# Patient Record
Sex: Female | Born: 1990
Health system: Southern US, Community
[De-identification: ages and names within clinical notes are randomized; demographics above are authoritative.]

## PROBLEM LIST (undated history)

## (undated) DIAGNOSIS — D649 Anemia, unspecified: Secondary | ICD-10-CM

## (undated) DIAGNOSIS — J302 Other seasonal allergic rhinitis: Secondary | ICD-10-CM

## (undated) DIAGNOSIS — J329 Chronic sinusitis, unspecified: Secondary | ICD-10-CM

## (undated) HISTORY — PX: NO PAST SURGERIES: SHX2092

---
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
## 2010-02-23 ENCOUNTER — Emergency Department (HOSPITAL_COMMUNITY): Admission: EM | Admit: 2010-02-23 | Discharge: 2010-02-23 | Payer: Self-pay | Admitting: Emergency Medicine

## 2010-04-30 ENCOUNTER — Emergency Department (HOSPITAL_COMMUNITY): Admission: EM | Admit: 2010-04-30 | Discharge: 2010-04-30 | Payer: Self-pay | Admitting: Family Medicine

## 2010-09-19 LAB — URINALYSIS, ROUTINE W REFLEX MICROSCOPIC
Bilirubin Urine: NEGATIVE
Glucose, UA: NEGATIVE mg/dL
Specific Gravity, Urine: 1.015 (ref 1.005–1.030)
Urobilinogen, UA: 0.2 mg/dL (ref 0.0–1.0)

## 2010-09-19 LAB — CBC
MCH: 28.2 pg (ref 26.0–34.0)
MCHC: 33.2 g/dL (ref 30.0–36.0)
MCV: 84.8 fL (ref 78.0–100.0)
Platelets: 232 10*3/uL (ref 150–400)
RBC: 4.01 MIL/uL (ref 3.87–5.11)
RDW: 13.1 % (ref 11.5–15.5)

## 2010-09-19 LAB — COMPREHENSIVE METABOLIC PANEL
AST: 35 U/L (ref 0–37)
Albumin: 3.2 g/dL — ABNORMAL LOW (ref 3.5–5.2)
BUN: 11 mg/dL (ref 6–23)
Calcium: 8.6 mg/dL (ref 8.4–10.5)
Chloride: 114 mEq/L — ABNORMAL HIGH (ref 96–112)
Creatinine, Ser: 0.8 mg/dL (ref 0.4–1.2)
GFR calc Af Amer: >60 mL/min (ref >60)
Total Bilirubin: 0.3 mg/dL (ref 0.3–1.2)
Total Protein: 7.1 g/dL (ref 6.0–8.3)

## 2010-09-19 LAB — URINE CULTURE
Colony Count: >=100000
Culture  Setup Time: 201108211314

## 2010-09-19 LAB — DIFFERENTIAL
Basophils Absolute: 0 10*3/uL (ref 0.0–0.1)
Eosinophils Relative: 0 % (ref 0–5)
Lymphocytes Relative: 14 % (ref 12–46)
Lymphs Abs: 1.4 10*3/uL (ref 0.7–4.0)
Monocytes Absolute: 0.4 10*3/uL (ref 0.1–1.0)
Neutro Abs: 8.5 10*3/uL — ABNORMAL HIGH (ref 1.7–7.7)

## 2010-09-19 LAB — URINE MICROSCOPIC-ADD ON

## 2010-09-19 LAB — RAPID URINE DRUG SCREEN, HOSP PERFORMED
Barbiturates: NOT DETECTED
Benzodiazepines: NOT DETECTED

## 2010-09-19 LAB — PREGNANCY, URINE: Preg Test, Ur: NEGATIVE

## 2010-11-18 NOTE — Op Note (Signed)
[name], [name]              ACCOUNT NO.:  [id number]   MEDICAL RECORD NO.:  [id number]          PATIENT TYPE:  AMB   LOCATION:  SDC                           FACILITY:  [facility]   PHYSICIAN:  Dois Davenport A. Rivard, M.D. DATE OF BIRTH:  1991/02/11   DATE OF PROCEDURE:  06/27/2007  DATE OF DISCHARGE:                               OPERATIVE REPORT   PREOPERATIVE DIAGNOSIS:  Moderate cervical dysplasia, CIN 2.   POSTOPERATIVE DIAGNOSIS:  Moderate cervical dysplasia, CIN 2.   ANESTHESIA:  Local. Dr. Estanislado Pandy.   PROCEDURE:  LEEP, or loupe electrical excision procedure.   SURGEON:  Dr. Estanislado Pandy.   ESTIMATED BLOOD LOSS:  Minimal.   PROCEDURE:  After being informed of the planned procedure with possible  complications including bleeding, infection, cervical stenosis, cervical  incompetence and persistence of disease, informed consent was obtained.  The patient is taken to OR #3, placed in lithotomy position and draped.   A speculum is inserted in the vagina, and the cervix was prepped with  acetic acid.  Colposcopy confirms the previously known lesion mainly  between the 11 and 1 o'clock position.  We then proceed with a  paracervical block using lidocaine 1% epinephrine 1:200 16 mL in the  usual fashion.  With a medium-sized loupe, we proceed with excision of  the anterior lip and the posterior lip of the cervix with the T zone  included.  The excision area is cauterized.  Hemostasis is adequate.  Monsel is applied to the cervix, and the specimen is identified as  anterior lip and posterior lip of the cervix, sent to pathology.  Instrument and sponge count is complete x2.  Estimated blood loss is  minimal.  The procedure is very well tolerated by the patient who is  taken to recovery room and discharged home in a well and stable  condition.      Crist Fat Rivard, M.D.  Electronically Signed     SAR/MEDQ  D:  06/27/2007  T:  06/28/2007  Job:  [id number]

## 2011-04-07 LAB — RAPID STREP SCREEN (MED CTR MEBANE ONLY): Streptococcus, Group A Screen (Direct): NEGATIVE

## 2011-06-16 ENCOUNTER — Encounter: Payer: Self-pay | Admitting: *Deleted

## 2011-06-16 ENCOUNTER — Emergency Department (HOSPITAL_BASED_OUTPATIENT_CLINIC_OR_DEPARTMENT_OTHER)
Admission: EM | Admit: 2011-06-16 | Discharge: 2011-06-16 | Disposition: A | Discharge status: Home / Self Care | Payer: Self-pay | Attending: Emergency Medicine | Admitting: Emergency Medicine

## 2011-06-16 DIAGNOSIS — R5381 Other malaise: Secondary | ICD-10-CM | POA: Insufficient documentation

## 2011-06-16 LAB — URINALYSIS, ROUTINE W REFLEX MICROSCOPIC
Glucose, UA: NEGATIVE mg/dL
Leukocytes, UA: NEGATIVE
Protein, ur: NEGATIVE mg/dL
Specific Gravity, Urine: 1.03 (ref 1.005–1.030)
Urobilinogen, UA: 1 mg/dL (ref 0.0–1.0)

## 2011-06-16 LAB — PREGNANCY, URINE: Preg Test, Ur: NEGATIVE

## 2011-06-16 MED ORDER — OSELTAMIVIR PHOSPHATE 75 MG PO CAPS: 75.0000 mg | ORAL_CAPSULE | Freq: Two times a day (BID) | ORAL | Status: AC

## 2011-06-16 MED ORDER — NAPROXEN 500 MG PO TABS: 500.0000 mg | ORAL_TABLET | Freq: Two times a day (BID) | ORAL | Status: DC

## 2011-06-16 NOTE — ED Notes (Signed)
C/o body aches, headache, and weakness for few days

## 2011-06-16 NOTE — ED Provider Notes (Signed)
History     CSN: [id number] Arrival date & time: 06/16/2011  9:06 PM   First MD Initiated Contact with Patient 06/16/11 2123      Chief Complaint  Patient presents with  . Fatigue    (Consider location/radiation/quality/duration/timing/severity/associated sxs/prior treatment) HPI Pt has been having aches and soreness for the last few days.  No coughing.  She does have some nasal congestion and a headache.  She does have myalgias.  No vomiting or diarrhea.  No rashes.  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  She was exposed to a coworker who had the flu.   History reviewed. No pertinent past medical history.  History reviewed. No pertinent past surgical history.  History reviewed. No pertinent family history.  History  Substance Use Topics  . Smoking status: Never Smoker   . Smokeless tobacco: Not on file  . Alcohol Use: No    OB History    Grav Para Term Preterm Abortions TAB SAB Ect Mult Living                  Review of Systems  All other systems reviewed and are negative.    Allergies  Review of patient's allergies indicates no known allergies.  Home Medications   Current Outpatient Rx  Name Route Sig Dispense Refill  . NORETHINDRONE ACET-ETHINYL EST 1.5-30 MG-MCG PO TABS Oral Take 1 tablet by mouth daily.        BP 117/74  Pulse 88  Temp(Src) 98.4 F (36.9 C) (Oral)  Resp 16  Ht 5\' 7"  (1.702 m)  Wt 140 lb (63.504 kg)  BMI 21.93 kg/m2  SpO2 100%  LMP 05/16/2011  Physical Exam  Nursing note and vitals reviewed. Constitutional: She appears well-developed and well-nourished. No distress.  HENT:  Head: Normocephalic and atraumatic.  Right Ear: External ear and ear canal normal. Tympanic membrane is not injected.  Left Ear: External ear and ear canal normal. Tympanic membrane is not injected.  Mouth/Throat: No oropharyngeal exudate.  Eyes: Conjunctivae are normal. Right eye exhibits no discharge. Left eye exhibits no  discharge. No scleral icterus.  Neck: Neck supple. No tracheal deviation present.  Cardiovascular: Normal rate, regular rhythm and intact distal pulses.   Pulmonary/Chest: Effort normal and breath sounds normal. No stridor. No respiratory distress. She has no wheezes. She has no rales.  Abdominal: Soft. Bowel sounds are normal. She exhibits no distension. There is no tenderness. There is no rebound and no guarding.  Musculoskeletal: She exhibits no edema and no tenderness.  Neurological: She is alert. She has normal strength. No sensory deficit. Cranial nerve deficit:  no gross defecits noted. She exhibits normal muscle tone. She displays no seizure activity. Coordination normal.  Skin: Skin is warm and dry. No rash noted. No erythema. No pallor.  Psychiatric: She has a normal mood and affect.    ED Course  Procedures (including critical care time)   Labs Reviewed  URINALYSIS, ROUTINE W REFLEX MICROSCOPIC  PREGNANCY, URINE   No results found.    MDM  Patient has had influenza exposure. She has not actually measured a fever yet but she has been having the body aches. She has not had significant cough. At this time I doubt meningitis, pneumonia, urinary tract infection. Encourage patient to continue medication such as Tylenol or Advil. She should drink many fluids. I will give her prescription for Tamiflu and instructed not to fill it and less she started having coughing and  fever.        Celene Kras, MD 06/16/11 2138

## 2012-02-19 IMAGING — CT CT HEAD W/O CM
1 of 2 series · 16 of 30 positions shown, 20 images · non-contrast
Comparison: None

CLINICAL DATA: Unresponsive.

CT HEAD WITHOUT CONTRAST
TECHNIQUE: Contiguous axial images were obtained from the base of
the skull through the vertex without contrast.

[Series 3: recon 2: brain · axial · 0.47mm/px · z∈[+106,+248]mm · 16 of 64 slices shown, 20 images]
[im 4/64  brain]
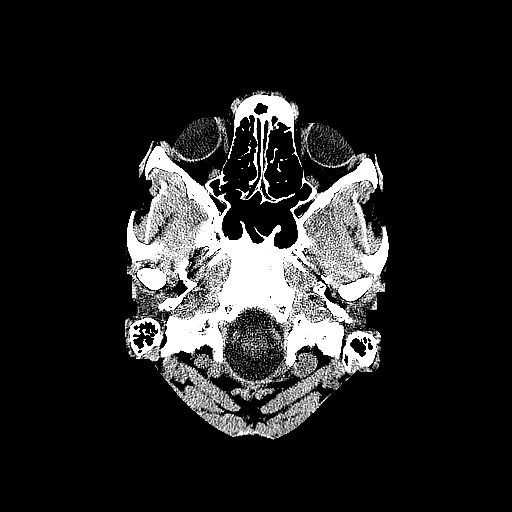
[im 4/64  bone]
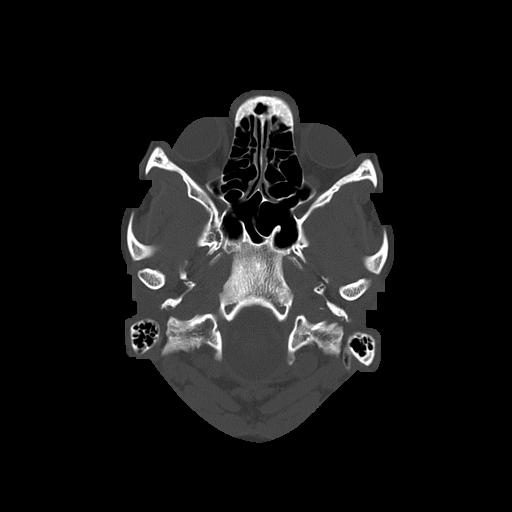
[im 7/64  brain]
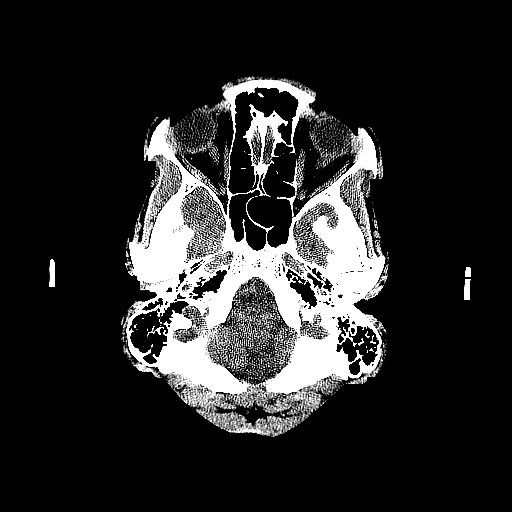
[im 10/64  brain]
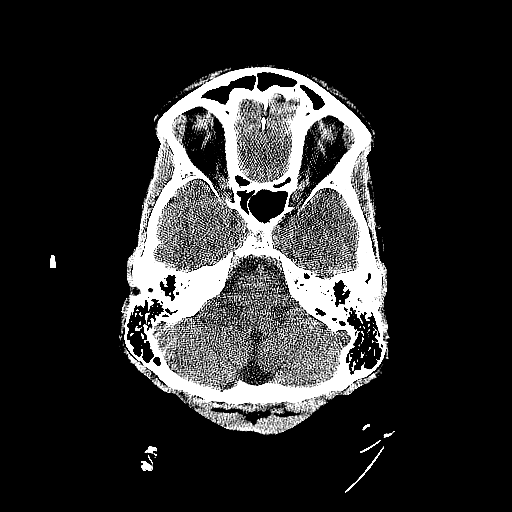
[im 14/64  brain]
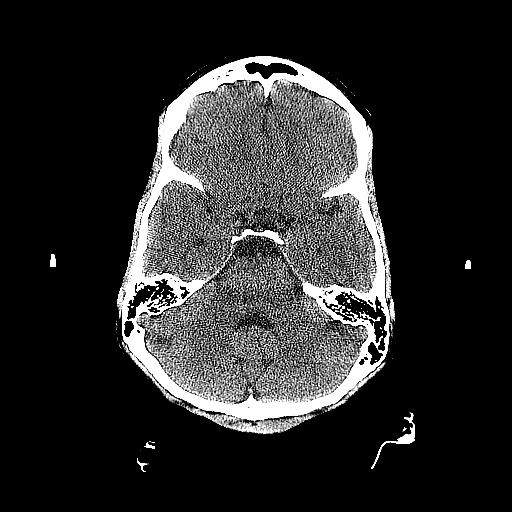
[im 20/64  brain]
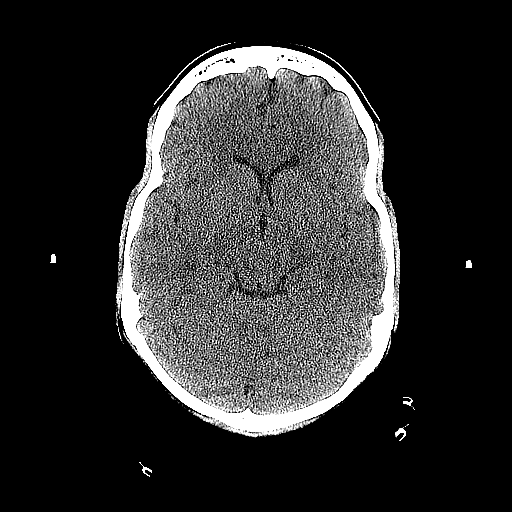
[im 20/64  bone]
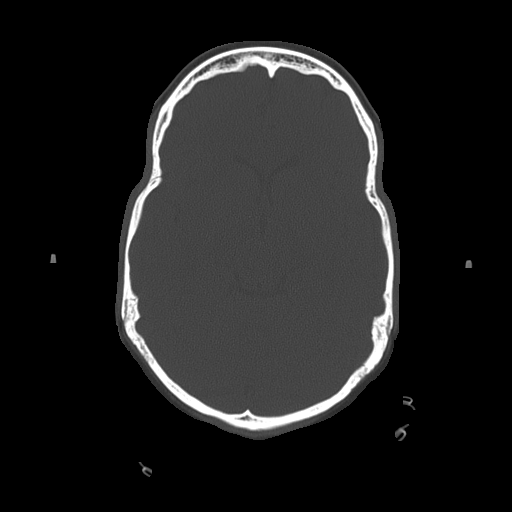
[im 24/64  brain]
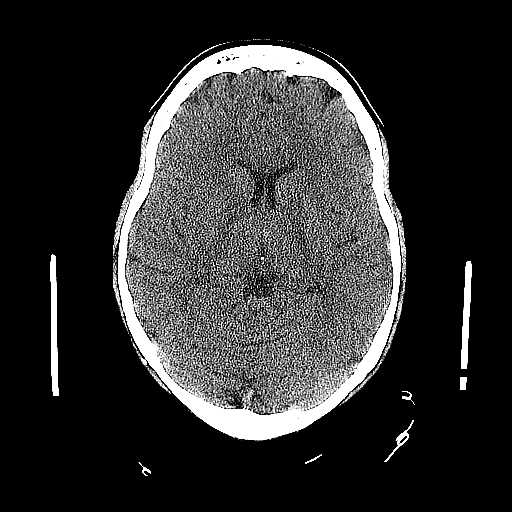
[im 27/64  brain]
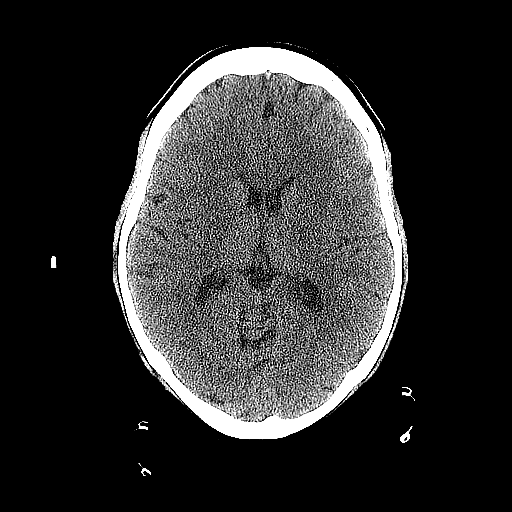
[im 30/64  brain]
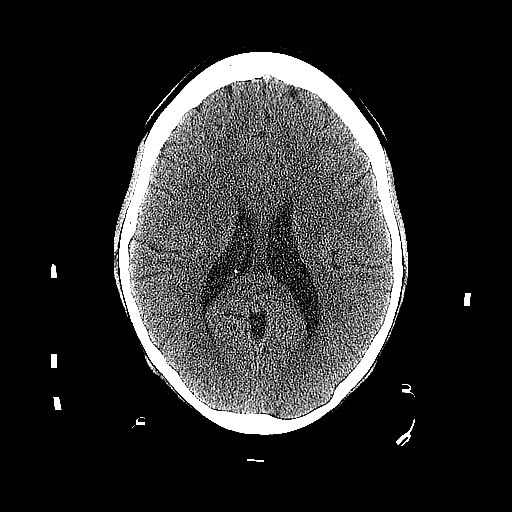
[im 34/64  brain]
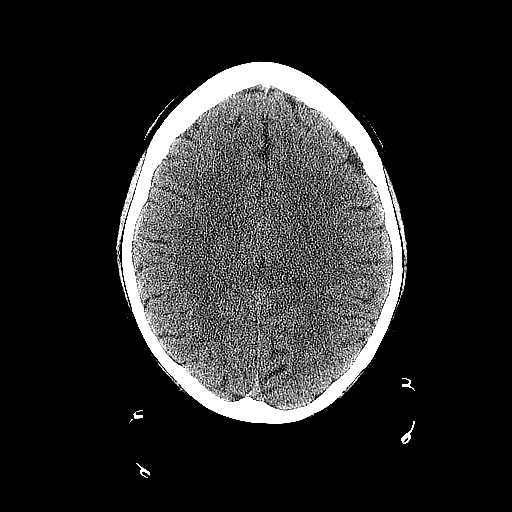
[im 34/64  bone]
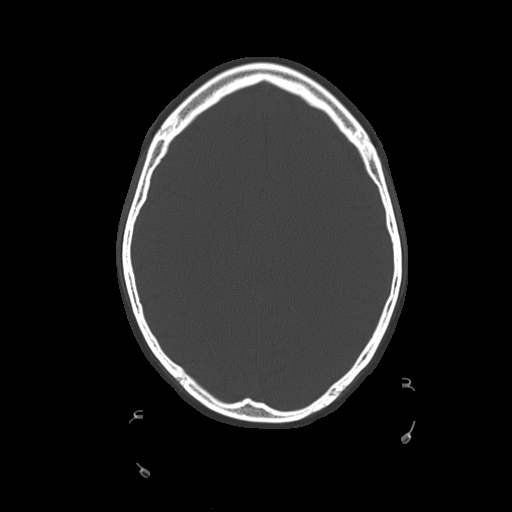
[im 37/64  brain]
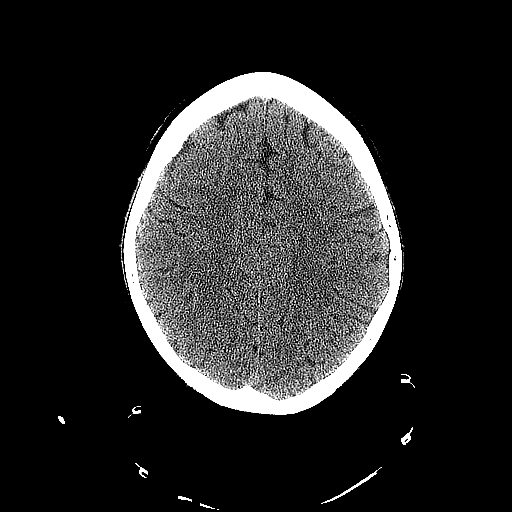
[im 40/64  brain]
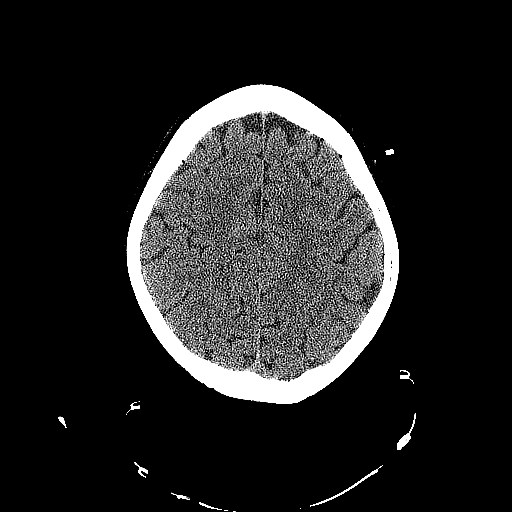
[im 44/64  brain]
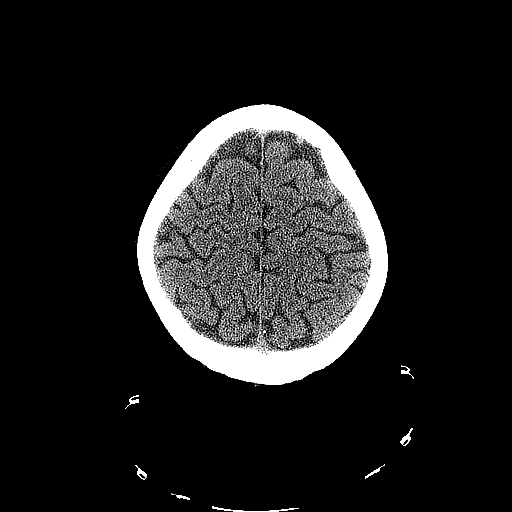
[im 50/64  brain]
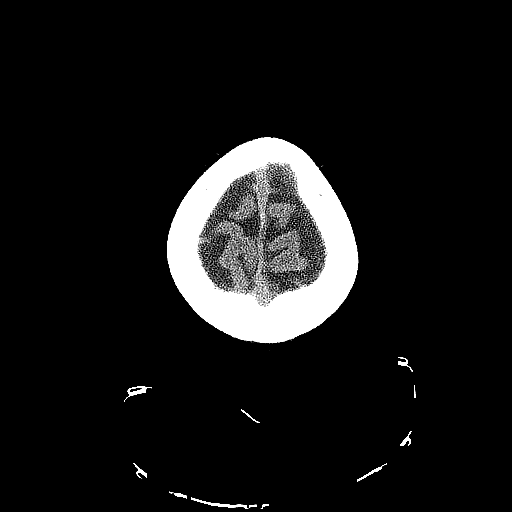
[im 50/64  bone]
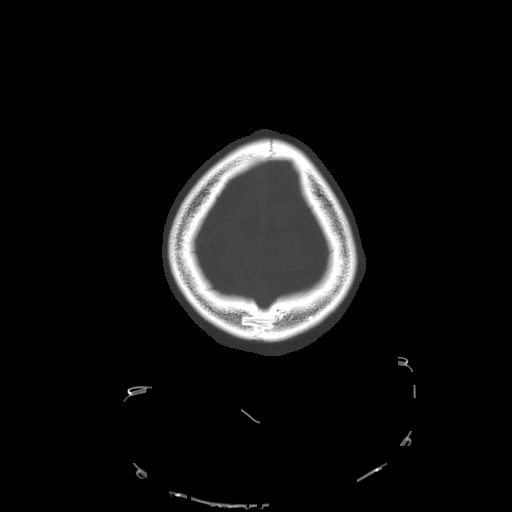
[im 54/64  brain]
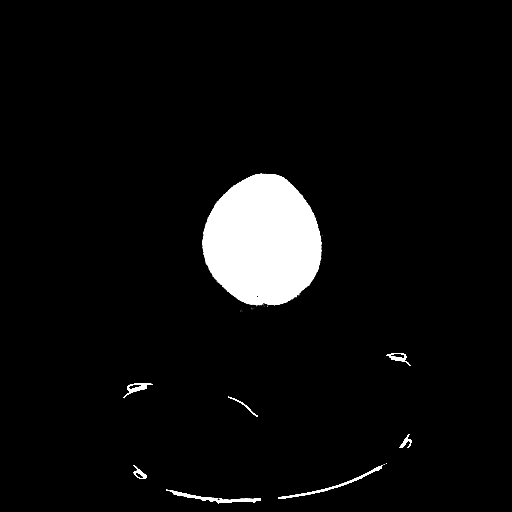
[im 57/64  brain]
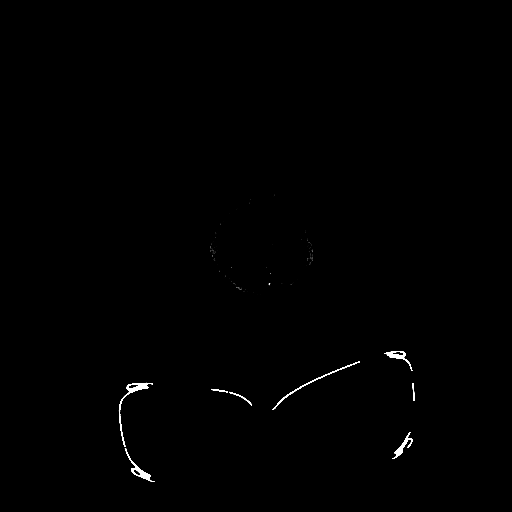
[im 60/64  brain]
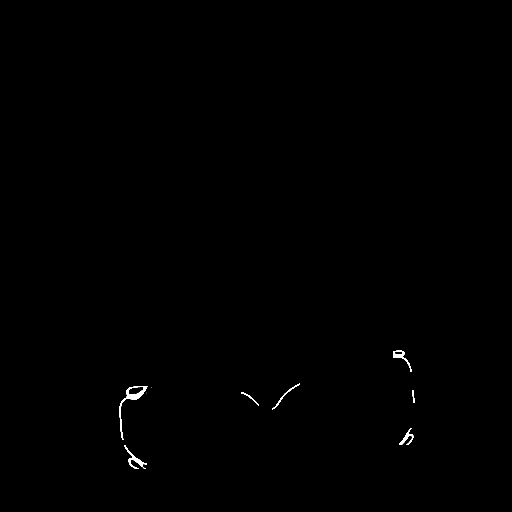

[16 of 30 positions shown; findings below may reference images not displayed]

FINDINGS: No acute intracranial abnormality.  Specifically, no
hemorrhage, hydrocephalus, mass lesion, acute infarction, or
significant intracranial injury.  No acute calvarial abnormality.
Visualized paranasal sinuses and mastoids clear.  Orbital soft
tissues unremarkable.
IMPRESSION: Normal study.

## 2012-03-15 ENCOUNTER — Encounter (HOSPITAL_COMMUNITY): Payer: Self-pay

## 2012-03-15 ENCOUNTER — Inpatient Hospital Stay (HOSPITAL_COMMUNITY)
Admission: AD | Admit: 2012-03-15 | Discharge: 2012-03-15 | Disposition: A | Discharge status: Home / Self Care | Payer: Self-pay | Source: Ambulatory Visit | Attending: Obstetrics and Gynecology | Admitting: Obstetrics and Gynecology

## 2012-03-15 DIAGNOSIS — D649 Anemia, unspecified: Secondary | ICD-10-CM

## 2012-03-15 DIAGNOSIS — R42 Dizziness and giddiness: Secondary | ICD-10-CM | POA: Insufficient documentation

## 2012-03-15 DIAGNOSIS — Z3202 Encounter for pregnancy test, result negative: Secondary | ICD-10-CM | POA: Insufficient documentation

## 2012-03-15 DIAGNOSIS — R5383 Other fatigue: Secondary | ICD-10-CM | POA: Insufficient documentation

## 2012-03-15 DIAGNOSIS — H538 Other visual disturbances: Secondary | ICD-10-CM | POA: Insufficient documentation

## 2012-03-15 DIAGNOSIS — R5381 Other malaise: Secondary | ICD-10-CM | POA: Insufficient documentation

## 2012-03-15 HISTORY — DX: Anemia, unspecified: D64.9

## 2012-03-15 LAB — COMPREHENSIVE METABOLIC PANEL
ALT: 13 U/L (ref 0–35)
AST: 20 U/L (ref 0–37)
Albumin: 3.6 g/dL (ref 3.5–5.2)
Calcium: 9.5 mg/dL (ref 8.4–10.5)
GFR calc Af Amer: >90 mL/min (ref >90)
Sodium: 139 mEq/L (ref 135–145)
Total Protein: 7.5 g/dL (ref 6.0–8.3)

## 2012-03-15 LAB — CBC
HCT: 33.8 % — ABNORMAL LOW (ref 36.0–46.0)
Hemoglobin: 10.5 g/dL — ABNORMAL LOW (ref 12.0–15.0)
MCH: 25.7 pg — ABNORMAL LOW (ref 26.0–34.0)
MCHC: 31.1 g/dL (ref 30.0–36.0)
MCV: 82.8 fL (ref 78.0–100.0)
Platelets: 253 K/uL (ref 150–400)
RBC: 4.08 MIL/uL (ref 3.87–5.11)
RDW: 14.8 % (ref 11.5–15.5)
WBC: 7.8 K/uL (ref 4.0–10.5)

## 2012-03-15 LAB — URINALYSIS, ROUTINE W REFLEX MICROSCOPIC
Bilirubin Urine: NEGATIVE
Glucose, UA: NEGATIVE mg/dL
Hgb urine dipstick: NEGATIVE
Ketones, ur: NEGATIVE mg/dL
Leukocytes, UA: NEGATIVE
Nitrite: NEGATIVE
Protein, ur: NEGATIVE mg/dL
Specific Gravity, Urine: >1.03 — ABNORMAL HIGH (ref 1.005–1.030)
Urobilinogen, UA: 0.2 mg/dL (ref 0.0–1.0)
pH: 6 (ref 5.0–8.0)

## 2012-03-15 NOTE — MAU Provider Note (Signed)
  History     CSN: [id number]  Arrival date and time: 03/15/12 1734   First Provider Initiated Contact with Patient 03/15/12 2144      Chief Complaint  Patient presents with  . Fatigue   HPI Sheena Cole is a 21 y.o. female who presents to MAU with fatigue. Symptoms started 2 weeks ago. Hx of anemia, no taking any iron or vitamins. Last night while in the shower got dizzy and passed out. This morning getting ready for work and felt dizzy but did not pass out. She is not pregnant and on no birth control. Sexually active with one partner x 2 years. No hx of STI's. Last pap smear one year ago with CC/OB. Also dx with anemia by CC/OB and given Fe. PCP is Josph Macho, PA. The history was provided by the patient.  OB History    Grav Para Term Preterm Abortions TAB SAB Ect Mult Living   0 0 0 0 0 0 0 0 0 0       Past Medical History  Diagnosis Date  . Anemia     Past Surgical History  Procedure Date  . No past surgeries     Family History  Problem Relation Age of Onset  . Other Neg Hx     History  Substance Use Topics  . Smoking status: Never Smoker   . Smokeless tobacco: Not on file  . Alcohol Use: Yes     socially    Allergies: No Known Allergies  Prescriptions prior to admission  Medication Sig Dispense Refill  . ibuprofen (ADVIL,MOTRIN) 200 MG tablet Take 400 mg by mouth every 6 (six) hours as needed. For headache      . naproxen sodium (ANAPROX) 220 MG tablet Take 220 mg by mouth daily as needed. For pain or headache        ROS: As stated in HPI  Blood pressure 134/92, pulse 69, temperature 98 F (36.7 C), temperature source Oral, resp. rate 18, height 5\' 9"  (1.753 m), weight 137 lb (62.143 kg), last menstrual period 02/06/2012, SpO2 100.00%.  Physical Exam: patient is alert and oriented and in no acute distress. Medical screening exam complete and patient is stable for continued care by the CC/OB Midwife. Procedures  Atreus Hasz, RN, FNP, [facility], [facility] 03/15/2012,  9:44 PM

## 2012-03-15 NOTE — MAU Note (Signed)
Pt reports off/on she has periods where her vision gets blurred and she feels like she is going to pass out, usually if she eats something it passes. Last pm and once this am it was worse and she "almost passed out", states decreased appetite and fatigue. LMP 02/06/2012

## 2012-03-15 NOTE — MAU Provider Note (Signed)
History     CSN: [id number]  Arrival date and time: 03/15/12 1734   First Provider Initiated Contact with Patient 03/15/12 2243      Chief Complaint  Patient presents with  . Fatigue   HPI Comments: Pt is a 21yo G0 arrives unannounced c/o fatigue, dizziness and occ blurry vision. States she's not been seen at [facility] in [facility].5years. States she has been dx'd w anemia but has not taken FE supplement. Denies any other health problems and no other regular meds. Pt told NP in MAU she had been seen this year. When I asked states it's been at least 1.5 years     Past Medical History  Diagnosis Date  . Anemia     Past Surgical History  Procedure Date  . No past surgeries     Family History  Problem Relation Age of Onset  . Other Neg Hx     History  Substance Use Topics  . Smoking status: Never Smoker   . Smokeless tobacco: Not on file  . Alcohol Use: Yes     socially    Allergies: No Known Allergies  No prescriptions prior to admission    Review of Systems  All other systems reviewed and are negative.   Physical Exam   Blood pressure 134/92, pulse 69, temperature 98 F (36.7 C), temperature source Oral, resp. rate 18, height 5\' 9"  (1.753 m), weight 137 lb (62.143 kg), last menstrual period 02/06/2012, SpO2 100.00%.  Physical Exam  Nursing note and vitals reviewed. Constitutional: She is oriented to person, place, and time. She appears well-developed and well-nourished.  HENT:  Head: Normocephalic.  Eyes: Pupils are equal, round, and reactive to light.  Neck: Normal range of motion.  Cardiovascular: Normal rate, regular rhythm and normal heart sounds.   Respiratory: Effort normal and breath sounds normal.  GI: Soft. Bowel sounds are normal.  Genitourinary:       Deferred   Musculoskeletal: Normal range of motion.  Neurological: She is alert and oriented to person, place, and time.  Skin: Skin is warm and dry.  Psychiatric: She has a normal mood and affect. Her  behavior is normal.   Results for orders placed during the hospital encounter of 03/15/12 (from the past 24 hour(s))  CBC     Status: Abnormal   Collection Time   03/15/12  5:52 PM      Component Value Range   WBC 7.8  4.0 - 10.5 K/uL   RBC 4.08  3.87 - 5.11 MIL/uL   Hemoglobin 10.5 (*) 12.0 - 15.0 g/dL   HCT 40.9 (*) 81.1 - 91.4 %   MCV 82.8  78.0 - 100.0 fL   MCH 25.7 (*) 26.0 - 34.0 pg   MCHC 31.1  30.0 - 36.0 g/dL   RDW 78.2  95.6 - 21.3 %   Platelets 253  150 - 400 K/uL  COMPREHENSIVE METABOLIC PANEL     Status: Normal   Collection Time   03/15/12  5:52 PM      Component Value Range   Sodium 139  135 - 145 mEq/L   Potassium 5.0  3.5 - 5.1 mEq/L   Chloride 107  96 - 112 mEq/L   CO2 25  19 - 32 mEq/L   Glucose, Bld 92  70 - 99 mg/dL   BUN 12  6 - 23 mg/dL   Creatinine, Ser 0.86  0.50 - 1.10 mg/dL   Calcium 9.5  8.4 - 57.8 mg/dL   Total  Protein 7.5  6.0 - 8.3 g/dL   Albumin 3.6  3.5 - 5.2 g/dL   AST 20  0 - 37 U/L   ALT 13  0 - 35 U/L   Alkaline Phosphatase 73  39 - 117 U/L   Total Bilirubin 0.3  0.3 - 1.2 mg/dL   GFR calc non Af Amer >90  >90 mL/min   GFR calc Af Amer >90  >90 mL/min  URINALYSIS, ROUTINE W REFLEX MICROSCOPIC     Status: Abnormal   Collection Time   03/15/12  7:00 PM      Component Value Range   Color, Urine YELLOW  YELLOW   APPearance CLEAR  CLEAR   Specific Gravity, Urine >1.030 (*) 1.005 - 1.030   pH 6.0  5.0 - 8.0   Glucose, UA NEGATIVE  NEGATIVE mg/dL   Hgb urine dipstick NEGATIVE  NEGATIVE   Bilirubin Urine NEGATIVE  NEGATIVE   Ketones, ur NEGATIVE  NEGATIVE mg/dL   Protein, ur NEGATIVE  NEGATIVE mg/dL   Urobilinogen, UA 0.2  0.0 - 1.0 mg/dL   Nitrite NEGATIVE  NEGATIVE   Leukocytes, UA NEGATIVE  NEGATIVE  POCT PREGNANCY, URINE     Status: Normal   Collection Time   03/15/12  7:10 PM      Component Value Range   Preg Test, Ur NEGATIVE  NEGATIVE     MAU Course  Procedures    Assessment and Plan  UPT neg Urine s.g.=>1.030,  otherwise normal Hgb=10.5  Recommend FE supplement Increase water intake F/u w primary care physician    Mohammedali Bedoy M 03/15/2012, 11:07 PM

## 2012-04-21 ENCOUNTER — Emergency Department (HOSPITAL_COMMUNITY): Payer: Self-pay

## 2012-04-21 ENCOUNTER — Encounter (HOSPITAL_COMMUNITY): Payer: Self-pay | Admitting: Emergency Medicine

## 2012-04-21 ENCOUNTER — Emergency Department (HOSPITAL_COMMUNITY)
Admission: EM | Admit: 2012-04-21 | Discharge: 2012-04-22 | Disposition: A | Discharge status: Home / Self Care | Payer: Self-pay | Attending: Emergency Medicine | Admitting: Emergency Medicine

## 2012-04-21 DIAGNOSIS — T50902A Poisoning by unspecified drugs, medicaments and biological substances, intentional self-harm, initial encounter: Secondary | ICD-10-CM

## 2012-04-21 DIAGNOSIS — T50992A Poisoning by other drugs, medicaments and biological substances, intentional self-harm, initial encounter: Secondary | ICD-10-CM | POA: Insufficient documentation

## 2012-04-21 DIAGNOSIS — T454X4A Poisoning by iron and its compounds, undetermined, initial encounter: Secondary | ICD-10-CM | POA: Insufficient documentation

## 2012-04-21 DIAGNOSIS — T1491XA Suicide attempt, initial encounter: Secondary | ICD-10-CM

## 2012-04-21 DIAGNOSIS — T40601A Poisoning by unspecified narcotics, accidental (unintentional), initial encounter: Secondary | ICD-10-CM | POA: Insufficient documentation

## 2012-04-21 LAB — COMPREHENSIVE METABOLIC PANEL
ALT: 10 U/L (ref 0–35)
AST: 16 U/L (ref 0–37)
Alkaline Phosphatase: 60 U/L (ref 39–117)
BUN: 11 mg/dL (ref 6–23)
CO2: 22 mEq/L (ref 19–32)
Calcium: 8.2 mg/dL — ABNORMAL LOW (ref 8.4–10.5)
Chloride: 104 mEq/L (ref 96–112)
GFR calc Af Amer: >90 mL/min (ref >90)
GFR calc Af Amer: >90 mL/min (ref >90)
GFR calc non Af Amer: >90 mL/min (ref >90)
Glucose, Bld: 84 mg/dL (ref 70–99)
Glucose, Bld: 86 mg/dL (ref 70–99)
Sodium: 135 mEq/L (ref 135–145)
Total Bilirubin: 0.3 mg/dL (ref 0.3–1.2)
Total Protein: 6.4 g/dL (ref 6.0–8.3)

## 2012-04-21 LAB — CBC
HCT: 29.6 % — ABNORMAL LOW (ref 36.0–46.0)
Hemoglobin: 9.5 g/dL — ABNORMAL LOW (ref 12.0–15.0)
MCH: 26.5 pg (ref 26.0–34.0)
MCHC: 32.1 g/dL (ref 30.0–36.0)
MCV: 82.5 fL (ref 78.0–100.0)

## 2012-04-21 LAB — CBC WITH DIFFERENTIAL/PLATELET
Basophils Absolute: 0 10*3/uL (ref 0.0–0.1)
Basophils Relative: 0 % (ref 0–1)
HCT: 28.8 % — ABNORMAL LOW (ref 36.0–46.0)
Hemoglobin: 9.4 g/dL — ABNORMAL LOW (ref 12.0–15.0)
Lymphocytes Relative: 29 % (ref 12–46)
MCHC: 32.6 g/dL (ref 30.0–36.0)
Monocytes Relative: 8 % (ref 3–12)
Neutro Abs: 4.8 10*3/uL (ref 1.7–7.7)
Neutrophils Relative %: 62 % (ref 43–77)
WBC: 7.8 10*3/uL (ref 4.0–10.5)

## 2012-04-21 LAB — POCT PREGNANCY, URINE: Preg Test, Ur: NEGATIVE

## 2012-04-21 LAB — ETHANOL: Alcohol, Ethyl (B): <11 mg/dL (ref 0–11)

## 2012-04-21 LAB — IRON: Iron: 245 ug/dL — ABNORMAL HIGH (ref 42–135)

## 2012-04-21 LAB — RAPID URINE DRUG SCREEN, HOSP PERFORMED
Benzodiazepines: NOT DETECTED
Opiates: POSITIVE — AB

## 2012-04-21 LAB — SALICYLATE LEVEL: Salicylate Lvl: 2.4 mg/dL — ABNORMAL LOW (ref 2.8–20.0)

## 2012-04-21 MED ORDER — ONDANSETRON HCL 4 MG PO TABS: 4.0000 mg | ORAL_TABLET | Freq: Three times a day (TID) | ORAL | Status: DC | PRN

## 2012-04-21 MED ORDER — LORAZEPAM 1 MG PO TABS: 1.0000 mg | ORAL_TABLET | Freq: Three times a day (TID) | ORAL | Status: DC | PRN

## 2012-04-21 MED ORDER — SODIUM CHLORIDE 0.9 % IV SOLN
1000.0000 mL | Freq: Once | INTRAVENOUS | Status: AC
  Administered 2012-04-21: 1000 mL via INTRAVENOUS

## 2012-04-21 MED ORDER — ZOLPIDEM TARTRATE 5 MG PO TABS: 5.0000 mg | ORAL_TABLET | Freq: Every evening | ORAL | Status: DC | PRN

## 2012-04-21 MED ORDER — SODIUM CHLORIDE 0.9 % IV SOLN
1000.0000 mL | INTRAVENOUS | Status: DC
  Administered 2012-04-21: 1000 mL via INTRAVENOUS

## 2012-04-21 NOTE — ED Notes (Signed)
Asked pt. To inform RN if she did not feel well, pt. Verbalized understanding.

## 2012-04-21 NOTE — ED Notes (Addendum)
Lu Duffel from [facility] called. They have received both samples and will process them STAT now. Results should be available in EPIC in 30-45 minutes. If not, call Lu Duffel directly at [phone].

## 2012-04-21 NOTE — ED Notes (Signed)
Telepsych consult being done at pt.'s bedside.

## 2012-04-21 NOTE — ED Notes (Signed)
Attempted to call the lab at [facility] to find out if they can run the ferritin level stat. Call was disconnected while speaking to the lab manager. Attempting to reconnect now.

## 2012-04-21 NOTE — ED Notes (Signed)
Spoke with Environmental manager at [facility].  States to [facility] any respiratory depression with Narcan.  Get iron level and KUB-further treatment after these have resulted.  NS fluid bolus and hydrate pt well.  Repeat iron level in 3 hours-get tylenol level at 0300.  Medical clearance labs, monitor.

## 2012-04-21 NOTE — ED Notes (Signed)
Pt wanting to know when she can go home. It was explained that when her iron level was normal the MD would release her.

## 2012-04-21 NOTE — ED Notes (Signed)
Provided an update to poison control. She said that the acetaminophen was not at toxic levels. To notify them when the ferritin levels have resulted.

## 2012-04-21 NOTE — ED Notes (Signed)
Spoke with lab-states both blood samples for the Ferritin levels are in the lab here.  States awaiting stat courier to take both samples to [facility].

## 2012-04-21 NOTE — Clinical Social Work Note (Signed)
Tele-psych recommendation to d/c home.  CSW gave pt outpt mental health resource list for outpatient f/u care. Vickii Penna, LCSWA [phone]  Clinical Social Work

## 2012-04-21 NOTE — ED Notes (Signed)
Spoke with Sheena Cole at the [facility]. They are locating the specimen now and sending a stat courier for the 2nd sample. Will contact us with the results.

## 2012-04-21 NOTE — ED Notes (Signed)
Per pt, she took 8 hydrocodone 7.5/500 and 20 iron tablets (Ferralet 90mg ). EMS called by her roommates. Per EMS, pt responded slowly to sternal rub. Sats originally 83%. NRB brought sats back up to 100% 2mg  Narcan given enroute. Pt A&O upon arrival.Sats 100% on ra.

## 2012-04-21 NOTE — ED Provider Notes (Addendum)
History     CSN: [id number]  Arrival date & time 04/21/12  0030   First MD Initiated Contact with Patient 04/21/12 0059      Chief Complaint  Patient presents with  . Drug Overdose     HPI The patient presents to the emergency room after an intentional drug overdose. Patient states she took these medications at about 11:30 this evening. She called her friend who apparently then called 911. Patient states she took 8 hydrocodone tablets that were 7.5 mg/500 mg as well as maybe 20, 90 mg iron tablets. Patient was found by EMS somewhat somnolent. She was given 2 mg of Narcan en route with improvement. Patient is currently alert and oriented. She denies any complaints. She does not want to tell me about what caused her to take these medications and she is also very vague about her intent. She actually denies suicidal ideation at this time but will not elaborate any more than that and will not explain why she took the medications the way she did Past Medical History  Diagnosis Date  . Anemia     Past Surgical History  Procedure Date  . No past surgeries     Family History  Problem Relation Age of Onset  . Other Neg Hx     History  Substance Use Topics  . Smoking status: Never Smoker   . Smokeless tobacco: Not on file  . Alcohol Use: Yes     socially    OB History    Grav Para Term Preterm Abortions TAB SAB Ect Mult Living   0 0 0 0 0 0 0 0 0 0       Review of Systems  All other systems reviewed and are negative.    Allergies  Review of patient's allergies indicates no known allergies.  Home Medications   Current Outpatient Rx  Name Route Sig Dispense Refill  . IBUPROFEN 200 MG PO TABS Oral Take 400 mg by mouth every 6 (six) hours as needed. For headache    . NAPROXEN SODIUM 220 MG PO TABS Oral Take 220 mg by mouth daily as needed. For pain or headache      BP 125/79  Pulse 101  Temp 98.5 F (36.9 C) (Oral)  Resp 17  SpO2 100%  LMP 03/22/2012  Physical  Exam  Nursing note and vitals reviewed. Constitutional: She appears well-developed and well-nourished. No distress.  HENT:  Head: Normocephalic and atraumatic.  Right Ear: External ear normal.  Left Ear: External ear normal.  Eyes: Conjunctivae normal are normal. Right eye exhibits no discharge. Left eye exhibits no discharge. No scleral icterus.  Neck: Neck supple. No tracheal deviation present.  Cardiovascular: Normal rate, regular rhythm and intact distal pulses.   Pulmonary/Chest: Effort normal and breath sounds normal. No stridor. No respiratory distress. She has no wheezes. She has no rales.  Abdominal: Soft. Bowel sounds are normal. She exhibits no distension. There is no tenderness. There is no rebound and no guarding.  Musculoskeletal: She exhibits no edema and no tenderness.  Neurological: She is alert. She has normal strength. No sensory deficit. Cranial nerve deficit:  no gross defecits noted. She exhibits normal muscle tone. She displays no seizure activity. Coordination normal.  Skin: Skin is warm and dry. No rash noted.  Psychiatric: She has a normal mood and affect.    ED Course  Procedures (including critical care time)  Labs Reviewed  CBC WITH DIFFERENTIAL - Abnormal; Notable for the following:  RBC 3.54 (*)     Hemoglobin 9.4 (*)     HCT 28.8 (*)     All other components within normal limits  FERRITIN - Abnormal; Notable for the following:    Ferritin 5 (*)     All other components within normal limits  SALICYLATE LEVEL - Abnormal; Notable for the following:    Salicylate Lvl 2.4 (*)     All other components within normal limits  URINE RAPID DRUG SCREEN (HOSP PERFORMED) - Abnormal; Notable for the following:    Opiates POSITIVE (*)     All other components within normal limits  COMPREHENSIVE METABOLIC PANEL - Abnormal; Notable for the following:    Potassium 3.3 (*)     Calcium 8.2 (*)     Albumin 3.1 (*)     All other components within normal limits    ACETAMINOPHEN LEVEL - Abnormal; Notable for the following:    Acetaminophen (Tylenol), Serum 32.0 (*)     All other components within normal limits  ACETAMINOPHEN LEVEL - Abnormal; Notable for the following:    Acetaminophen (Tylenol), Serum 34.8 (*)     All other components within normal limits  FERRITIN - Abnormal; Notable for the following:    Ferritin 4 (*)     All other components within normal limits  ETHANOL  POCT PREGNANCY, URINE  PROTIME-INR   Dg Abd 1 View  04/21/2012  *RADIOLOGY REPORT*  Clinical Data: Drug overdose.  ABDOMEN - 1 VIEW  Comparison: None.  Findings: Scattered gas and stool in the colon and small bowel.  No small or large bowel distension.  No radiopaque stones.  Visualized bones appear intact.  Metallic piercing along the midline.  IMPRESSION: Nonobstructive bowel gas pattern.   Original Report Authenticated By: Marlon Pel, M.D.      1. Suicide attempt   2. Drug overdose, intentional       MDM  1800 mg iron potentially.  <20 mg/kg  The patient's 4 hour acetaminophen level is less than the toxic level. Her ferritin level is still pending. The patient remained hemodynamically stable  The patient's iron levels have returned. They're both in the nontoxic range and she will not require treatment with deferoxamine. Patient is medically stable for psychiatric assessment 6:39 AM         Celene Kras, MD 04/21/12 [phone]  Repeat iron level has declined.  Pt remains asymptomatic this am.  No abdominal pain, no nausea or vomiting.  Medically clear.   Celene Kras, MD 04/22/12 [phone]

## 2012-04-21 NOTE — ED Notes (Signed)
WUJ:[id number]<JY> Expected date:<BR> Expected time:<BR> Means of arrival:<BR> Comments:<BR> EMS from [facility]-overdose ~10 hydrocodone-RR10

## 2012-04-21 NOTE — ED Notes (Signed)
EDP notified of abnormal iron result.

## 2012-04-21 NOTE — ED Provider Notes (Signed)
Pt seen by Dr Lynelle Doctor earlier, was deemed medically clear and waiting for telepsych eval.   telepsych eval states impulsive/stress rxn, and psych stable for d/c.  On review initial labs, no fe level noted. Added to labs. Returns high. On recheck of pt, pt denies any gi symptoms, no abd pain, no nvd, normal appetite. Pt confirms amount of ingestion 20 tablets, ferralet. Discussed w poison control, including labs, vitals, etc.  They recommend repeating chemistries, and obs for 24 hours. They indicate if remains asymptomatic, labs stable should be clear at that point.  Given amount ingestion, absence of gi symptoms, neg xray findings, feel likelihood significant toxicity low.   Suzi Roots, MD 04/21/12 [phone]

## 2012-04-22 NOTE — ED Provider Notes (Signed)
Filed Vitals:   04/22/12 0600  BP: 96/55  Pulse: 69  Temp: 98.2 F (36.8 C)  Resp: 15    Pt seen and assessed. No new complaints. Repeat Fe level declined. Pt remains asymptomatic and would like to leave. Pt evaluated by psychiatry and recommending DC.  Raeford Razor, MD 04/22/12 [phone]

## 2012-04-22 NOTE — ED Notes (Signed)
Patient is resting comfortably. 

## 2012-04-22 NOTE — ED Notes (Signed)
Pt calm, cooperative, denies SI.

## 2014-04-17 IMAGING — CR DG ABDOMEN 1V
1 series · 1 of 1 positions shown · non-contrast
Comparison: None.

CLINICAL DATA: Drug overdose.

ABDOMEN - 1 VIEW

[AP]
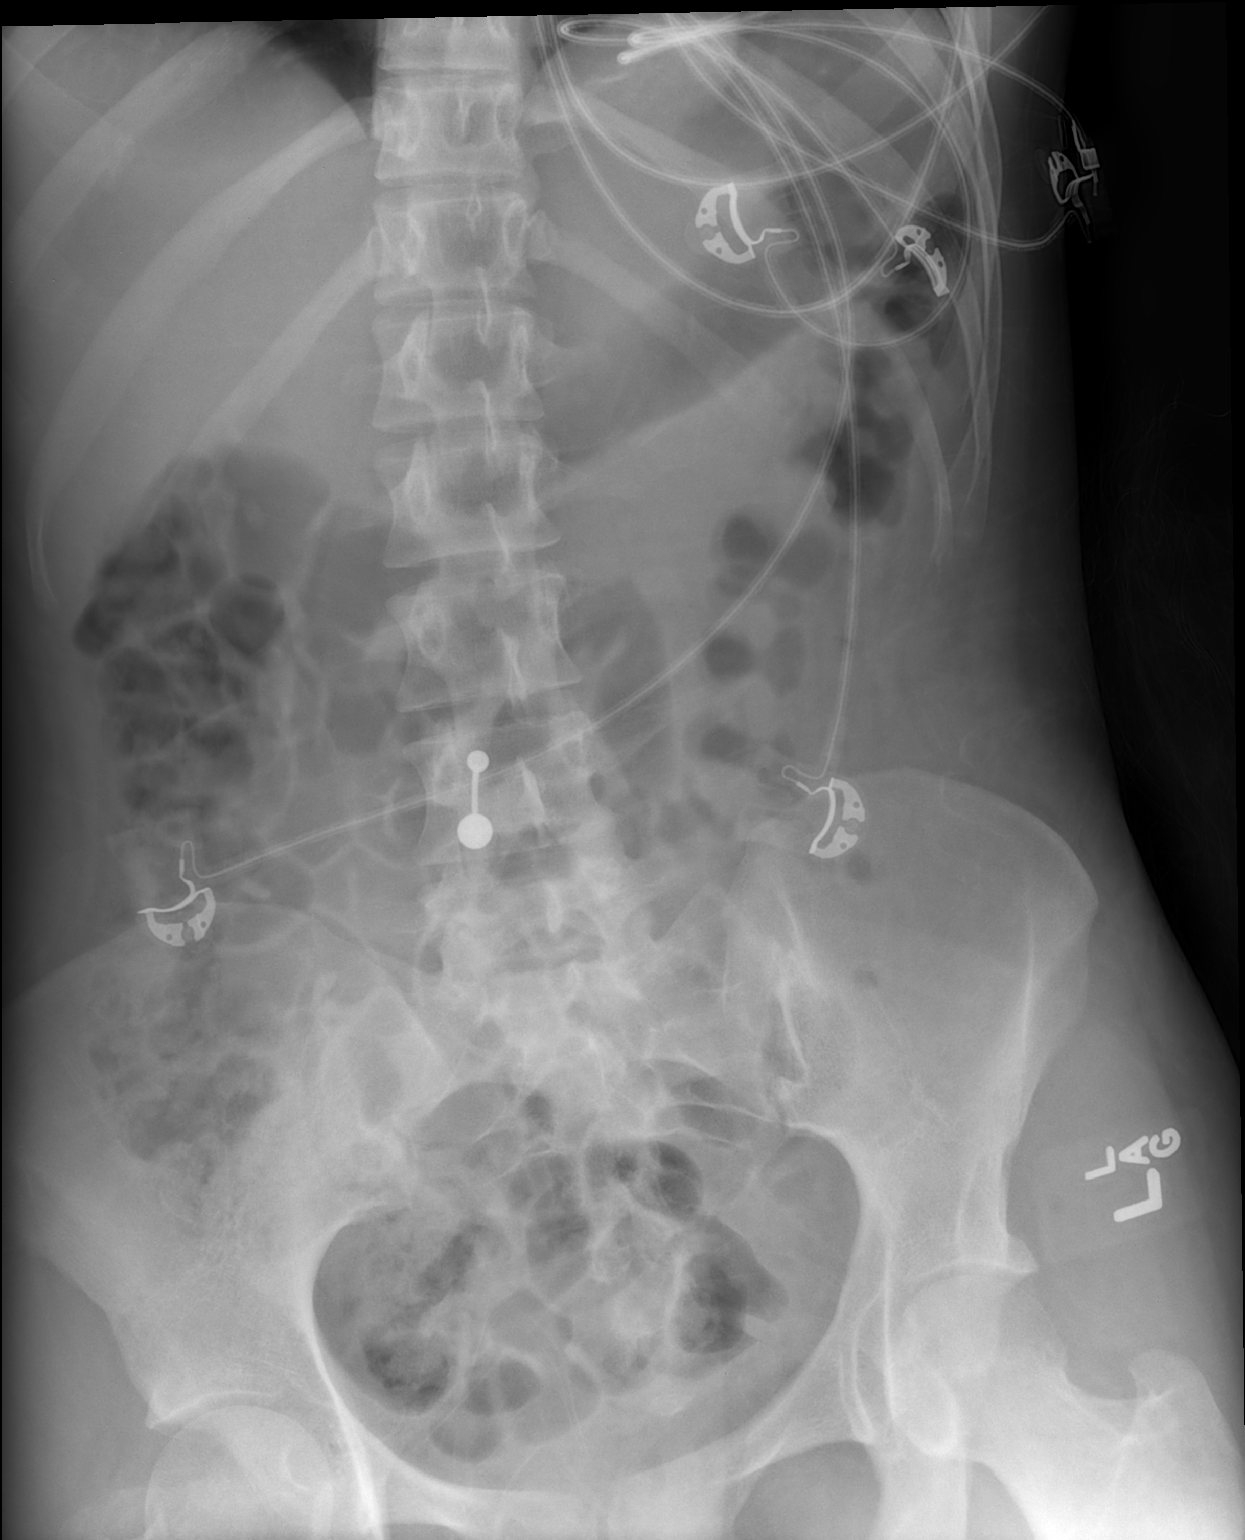

[1 of 1 positions shown; findings below may reference images not displayed]

FINDINGS: Scattered gas and stool in the colon and small bowel.  No
small or large bowel distension.  No radiopaque stones.  Visualized
bones appear intact.  Metallic piercing along the midline.
IMPRESSION: Nonobstructive bowel gas pattern.

## 2014-10-01 ENCOUNTER — Emergency Department (HOSPITAL_BASED_OUTPATIENT_CLINIC_OR_DEPARTMENT_OTHER)
Admission: EM | Admit: 2014-10-01 | Discharge: 2014-10-02 | Disposition: A | Discharge status: Home / Self Care | Payer: Self-pay | Attending: Emergency Medicine | Admitting: Emergency Medicine

## 2014-10-01 ENCOUNTER — Encounter (HOSPITAL_BASED_OUTPATIENT_CLINIC_OR_DEPARTMENT_OTHER): Payer: Self-pay | Admitting: *Deleted

## 2014-10-01 DIAGNOSIS — Z862 Personal history of diseases of the blood and blood-forming organs and certain disorders involving the immune mechanism: Secondary | ICD-10-CM | POA: Insufficient documentation

## 2014-10-01 DIAGNOSIS — M791 Myalgia: Secondary | ICD-10-CM | POA: Insufficient documentation

## 2014-10-01 DIAGNOSIS — Z791 Long term (current) use of non-steroidal anti-inflammatories (NSAID): Secondary | ICD-10-CM | POA: Insufficient documentation

## 2014-10-01 DIAGNOSIS — J020 Streptococcal pharyngitis: Secondary | ICD-10-CM | POA: Insufficient documentation

## 2014-10-01 LAB — RAPID STREP SCREEN (MED CTR MEBANE ONLY): STREPTOCOCCUS, GROUP A SCREEN (DIRECT): NEGATIVE

## 2014-10-01 MED ORDER — HYDROCODONE-ACETAMINOPHEN 5-325 MG PO TABS: 1.0000 | ORAL_TABLET | ORAL | Status: DC | PRN

## 2014-10-01 MED ORDER — PENICILLIN G BENZATHINE 1200000 UNIT/2ML IM SUSP
1.2000 10*6.[IU] | Freq: Once | INTRAMUSCULAR | Status: AC
  Administered 2014-10-01: 1.2 10*6.[IU] via INTRAMUSCULAR
  Filled 2014-10-01: qty 2

## 2014-10-01 MED ORDER — ACETAMINOPHEN 325 MG PO TABS
650.0000 mg | ORAL_TABLET | Freq: Once | ORAL | Status: AC
  Administered 2014-10-01: 650 mg via ORAL
  Filled 2014-10-01: qty 2

## 2014-10-01 MED ORDER — NAPROXEN 250 MG PO TABS
500.0000 mg | ORAL_TABLET | Freq: Once | ORAL | Status: AC
  Administered 2014-10-01: 500 mg via ORAL
  Filled 2014-10-01: qty 2

## 2014-10-01 MED ORDER — NAPROXEN 500 MG PO TABS: 500.0000 mg | ORAL_TABLET | Freq: Two times a day (BID) | ORAL | Status: DC

## 2014-10-01 NOTE — Discharge Instructions (Signed)
Please follow directions provided. Use the resource guide or the referral given to establish care with a primary care doctor to follow-up with. Please take the naproxen twice a day to help with pain and inflammation. You may take the Vicodin for pain not relieved by the naproxen. The treated in the emergency department with a shot of antibiotics. Continue drink plenty of fluids to stay well hydrated. Don't hesitate to return for any new, worsening, or concerning symptoms.   SEEK IMMEDIATE MEDICAL CARE IF:  You develop any new symptoms such as vomiting, severe headache, stiff or painful neck, chest pain, shortness of breath, or trouble swallowing.  You develop severe throat pain, drooling, or changes in your voice.  You develop swelling of the neck, or the skin on the neck becomes red and tender.  You develop signs of dehydration, such as fatigue, dry mouth, and decreased urination.  You become increasingly sleepy, or you cannot wake up completely.    Emergency Department Resource Guide 1) Find a Doctor and Pay Out of Pocket Although you won't have to find out who is covered by your insurance plan, it is a good idea to ask around and get recommendations. You will then need to call the office and see if the doctor you have chosen will accept you as a new patient and what types of options they offer for patients who are self-pay. Some doctors offer discounts or will set up payment plans for their patients who do not have insurance, but you will need to ask so you aren't surprised when you get to your appointment.  2) Contact Your Local Health Department Not all health departments have doctors that can see patients for sick visits, but many do, so it is worth a call to see if yours does. If you don't know where your local health department is, you can check in your phone book. The CDC also has a tool to help you locate your state's health department, and many state websites also have listings of all of  their local health departments.  3) Find a Walk-in Clinic If your illness is not likely to be very severe or complicated, you may want to try a walk in clinic. These are popping up all over the country in pharmacies, drugstores, and shopping centers. They're usually staffed by nurse practitioners or physician assistants that have been trained to treat common illnesses and complaints. They're usually fairly quick and inexpensive. However, if you have serious medical issues or chronic medical problems, these are probably not your best option.  No Primary Care Doctor: - Call Health Connect at  [phone] - they can help you locate a primary care doctor that  accepts your insurance, provides certain services, etc. - Physician Referral Service- [phone]  Chronic Pain Problems: Organization         Address  Phone   Notes  [facility]  [phone]) [phone] Patients need to be referred by their primary care doctor.   Medication Assistance: Organization         Address  Phone   Notes  [facility] Medication [facility] [address] [place], [place] [address] [phone]) [phone] --Must be a resident of [facility] [place] -- Must have NO insurance coverage whatsoever (no Medicaid/ Medicare, etc.) -- The pt. MUST have a primary care doctor that directs their care regularly and follows them in the community   [facility]ist  [phone]) [phone]   [address]  [phone]  Agencies that provide inexpensive medical care: Organization         Address  Phone   Notes  [facility]  [phone]) [phone]   [facility]    [phone]   [facility] [address] [place], [place] [address] [phone]) [phone]   [facility] [place] [address], [place] [phone]) [phone]   Planned Parenthood    [phone]) [phone]   [facility]    [phone](336) [phone]   Community Health and [facility]  [address],  [place] Phone:  [phone]) [phone], Fax:  [phone]) [phone] Hours of Operation:  9 am - 6 pm, M-F.  Also accepts Medicaid/Medicare and self-pay.  [facility]  [address], [place] Phone: [phone]) [phone], Fax: [phone]. Hours of Operation:  8:30 am - 5:30 pm, M-F.  Also accepts Medicaid and self-pay.  [facility] [place] [address], [place] Phone: [phone]) [phone]   [facility] [address], [place], [place] [phone], Ext. [phone] Mondays & Thursdays: 7-9 AM.  First 15 patients are seen on a first come, first serve basis.    Medicaid-accepting [facility] Providers:  Organization         Address  Phone   Notes  [facility] [address], House [phone]) [phone] Also accepts self-pay patients.  [facility] [address], [place]  [phone]) [phone]   [facility] [address], [place] [phone]) [phone]   [facility] [address] [place] [address], [place] [phone]) [phone]   Renaye RakersVeita Bland [address], [place]   [phone] Only accepts [place] Access [place] patients after they have their name applied to their card.   Self-Pay (no insurance) in [facility]:  Organization         Address  Phone   Notes  Sickle Cell Patients, [facility] [address], [place] [phone]   [facility] [address], [place] [phone]) [phone]   [facility] [place]  [address], [place] [phone]   [facility]/Dr. Osei-Bonsu  [address] [place] [address], [place] or [phone], [place] [phone] Phone number for both [place] and [place] locations is the same.  [facility] and [facility] [address], [place] [phone]   [facility] [place] [address] [place] [address], [place] or [address] [phone]   [facility] [address], [place] [phone]) [phone], phone; [phone](336) [phone], fax Sees patients 1st and 3rd Saturday of every month.  Must not qualify for public or private insurance (i.e. Medicaid, Medicare, [place] Health Choice, Veterans' Benefits)  Household income should be no more than 200% of the poverty level The clinic cannot treat you if you are pregnant or think you are pregnant  Sexually transmitted diseases are not treated at the clinic.    Dental Care: Organization         Address  Phone  Notes  [facility] of [facility] [address], [place] [phone] Accepts children up to age 25 who are enrolled in [place]icaid or [place] Health Choice; pregnant women with a Medicaid card; and children who have applied for Medicaid or  [place] Health Choice, but were declined, whose parents can pay a reduced fee at time of service.  [facility] of [facility]  [address], [place] [phone] Accepts children up to age 52 who are enrolled in [place] or [place] Health Choice; pregnant women with a Medicaid card; and children who have applied for Medicaid or [place] Health Choice, but were declined, whose parents can pay a reduced fee at time of service.  [facility]  [address] [place], [place] [phone] Patients are seen by appointment only. Walk-ins are not accepted. [facility] will see patients 48 years of age and older. Monday - Tuesday (8am-5pm) Most Wednesdays (8:30-5pm) $30 per visit, cash only  [facility] Access [facility]  [address], [facility] [phone] Patients are seen by appointment only. Walk-ins are not accepted. [facility] will see patients 81 years of age and older. One Wednesday Evening (Monthly: Volunteer Based).  $30 per visit, cash only  [employer]   [phone] for adults; Children under age 28, call Graduate Pediatric Dentistry at [phone]. Children aged 64-14, please call [phone] to request a pediatric application.  Dental services are provided in all areas of dental care including fillings, crowns and bridges, complete and partial dentures, implants, gum treatment, root canals, and extractions. Preventive care is also provided. Treatment is provided to both adults and children. Patients are selected via a lottery and there is often a waiting list.   [facility] [address] [place]  [phone] [email or web address]   [facility] [address] [place], [place] [phone], Ext. [phone] Second and Fourth Thursday of each month, opens at 6:30 AM; Clinic ends at 9 AM.  Patients are seen on a first-come first-served basis, and a limited number are seen during each clinic.   [facility]  [address] [place], [place] [phone]   Eligibility Requirements You must have lived in [place], [place], or [place] counties for at least the last three months.   You cannot be eligible for state or federal sponsored [place], including [employer], [place], or [employer].   You generally cannot be eligible for healthcare insurance through your employer.    How to apply: Eligibility screenings are held every Tuesday and Wednesday afternoon from 1:00 pm until 4:00 pm. You do not need an appointment for the interview!  [facility] [address], [place], [place] [phone]   [facility] Health Department  [phone]   [facility] Health Department  [phone]   [facility] Health Department  [phone]    Behavioral Health Resources in the Community: Intensive Outpatient Programs Organization         Address  Phone  Notes  [facility] [address], [place], [place] [phone]   [facility] [address], [place], [place] [phone]   [facility]: [facility] Drug S[facility] [address], [place], [place]  [phone]   [facility] [address]  [place], [place] [phone] or [phone]   Substance Abuse Resources Organization         Address  Phone  Notes  Alcohol and Drug Services  [phone]   [facility]  [phone]   [facility] [place]  [phone]   Floydene Flock  [phone]   Residential & Outpatient Substance Abuse Program  [phone]  Psychological Services Organization         Address  Phone  Notes  [facility]  [place]  [phone]   [place] [address], [place] or [phone]    Mobile Crisis Teams Organization         Address  Phone  Notes  [facility], Mobile Crisis Care Unit  [phone]   [facility]  [address] [place], [place]   [place] Levels [address], [place] [phone]    Self-Help/Support Groups Organization         Address  Phone             Notes  [place]. of [place] - variety of support groups  Boyd Call for more information  Narcotics Anonymous (NA), [facility] [address], [employer] [place]  2 meetings at this location   Special educational needs teacher         Address  Phone  Notes  [facility] Residential Treatment [place],    [place]  [phone]   [facility]  [address], [place] [address], [place], [place]   [place], [place] [phone] Admissions: 8am-3pm M-F  Incentives Substance [place] [address],    [place], [place] [phone]   [facility] [address] [place], [place], [place]   [facility] [address]  [place], [place]     Insight Programs - Intensive Outpatient [address], [place], [place]   [facility] ([place] [address].,  [place], [place] [phone] or [phone]   [facility] (RTS) [address], [place], [place] Accepts Medicaid  [facility] [place] [address],  [place] [phone] Substance Abuse/Addiction Treatment   [facility] Organization         Address  Phone  Notes  [facility]  [phone]   Domenic Schwab, PhD [address] Arlis Porta [place], [place]   [phone] or [phone]   [place], [place] [phone]   [facility] [address], [place], [place] [phone] Insurance/Medicaid/sponsorship through [facility] and Families [address], [address]                                    [place], [place] [phone] [place] [address], [place] [phone]    Dr. Adele Schilder  [phone]   [facility] Dept. 1) [address], [place] 2) [place] 3)  [address] [place] [address], [place] [phone]  [phone]   [place] [phone] or [phone] (After Hours)

## 2014-10-01 NOTE — ED Notes (Signed)
Sore throat, facial pain, and body aches.

## 2014-10-01 NOTE — ED Provider Notes (Signed)
CSN: [id number]     Arrival date & time 10/01/14  2234 History  This chart was scribed for non-physician practitioner working with Sheena Maw Ward, DO by Sheena Cole, ED Scribe. This patient was seen in room MH04/MH04 and the patient's care was started at 10:56 PM.    Chief Complaint  Patient presents with  . Sore Throat   The history is provided by the patient. No language interpreter was used.   HPI Comments: Sheena Cole is a 24 y.o. female who presents to the Emergency Department complaining of a waxing and waning sore throat for the last 2 weeks. Pt reports associated body aches and facial pain that started 3 days ago. Pt states that she noticed a voice change 4 days ago due to a worsening sore throat. She reports an associated subjective fever and nonproductive cough as well. She rates her discomfort as a 6/10 at this time. Pt states that she has tried taking ibuprofen with no relief. She reports NKDA. She denies nausea and chills.   Past Medical History  Diagnosis Date  . Anemia    Past Surgical History  Procedure Laterality Date  . No past surgeries     Family History  Problem Relation Age of Onset  . Other Neg Hx    History  Substance Use Topics  . Smoking status: Never Smoker   . Smokeless tobacco: Not on file  . Alcohol Use: Yes     Comment: socially   OB History    Gravida Para Term Preterm AB TAB SAB Ectopic Multiple Living       Review of Systems  Constitutional: Positive for fever. Negative for chills.  HENT: Positive for sore throat and voice change.   Respiratory: Positive for cough.   Gastrointestinal: Negative for nausea.  Musculoskeletal: Positive for myalgias.  All other systems reviewed and are negative.     Allergies  Review of patient's allergies indicates no known allergies.  Home Medications   Prior to Admission medications   Medication Sig Start Date End Date Taking? Authorizing Provider  ibuprofen  (ADVIL,MOTRIN) 200 MG tablet Take 400 mg by mouth every 6 (six) hours as needed. For headache    Historical Provider, MD  naproxen sodium (ANAPROX) 220 MG tablet Take 220 mg by mouth daily as needed. For pain or headache    Historical Provider, MD   BP 124/83 mmHg  Pulse 108  Temp(Src) 99.5 F (37.5 C) (Oral)  Resp 20  Ht  (1.753 m)  Wt 135 lb (61.236 kg)  BMI 19.93 kg/m2  SpO2 100%  LMP 09/03/2014   Physical Exam  Constitutional: She is oriented to person, place, and time. She appears well-developed and well-nourished.  HENT:  Head: Normocephalic and atraumatic.  Right Ear: Tympanic membrane normal.  Left Ear: Tympanic membrane normal.  Mouth/Throat: No trismus in the jaw. No uvula swelling. Oropharyngeal exudate, posterior oropharyngeal edema and posterior oropharyngeal erythema present. No tonsillar abscesses.  Oropharyngeal edema bilaterally, erythema and exudate of tonsils bilaterally.   Eyes: Right eye exhibits no discharge. Left eye exhibits no discharge.  Neck: Neck supple. No tracheal deviation present.  Cardiovascular: Normal rate.   Pulmonary/Chest: Effort normal. No respiratory distress.  Abdominal: She exhibits no distension.  Neurological: She is alert and oriented to person, place, and time.  Skin: Skin is warm and dry.  Psychiatric: She has a normal mood and affect. Her behavior is normal.  Nursing  note and vitals reviewed.   ED Course  Procedures   DIAGNOSTIC STUDIES: Oxygen Saturation is 100% on RA, normal by my interpretation.    COORDINATION OF CARE: 11:00 PM Discussed treatment plan with pt at bedside and pt agreed to plan.   Labs Review Labs Reviewed  RAPID STREP SCREEN  CULTURE, GROUP A STREP    Imaging Review No results found.   EKG Interpretation None      MDM   Final diagnoses:  Strep pharyngitis   24 yo with sore throat, difficulty swallowing, fever, tonsillar exudate, and cervical lymphadenopathy.  Her strep screen is  negative but based on presentation, pt has been diagnosed with strep. Treated in ED with tylenol and NSAIDs and IM PCN. Pt tolerating POs well. Her exam is not concerning for peritonsillar abscess, trismus or uvula deviation. Specific return precautions discussed. Pt is well-appearing, in no acute distress and vital signs reviewed and not concerning. She appears safe to be discharged.  Discharge include follow-up with their PCP.  Return precautions provided. Pt aware of plan and in agreement.    I personally performed the services described in this documentation, which was scribed in my presence. The recorded information has been reviewed and is accurate.  Filed Vitals:   10/01/14 2239  BP: 124/83  Pulse: 108  Temp: 99.5 F (37.5 C)  TempSrc: Oral  Resp: 20  Height: 5\' 9"  (1.753 m)  Weight: 135 lb (61.236 kg)  SpO2: 100%   Meds given in ED:  Medications  penicillin g benzathine (BICILLIN LA) 1200000 UNIT/2ML injection 1.2 Million Units (1.2 Million Units Intramuscular Given 10/01/14 2314)  naproxen (NAPROSYN) tablet 500 mg (500 mg Oral Given 10/01/14 2313)  acetaminophen (TYLENOL) tablet 650 mg (650 mg Oral Given 10/01/14 2313)    Discharge Medication List as of 10/01/2014 11:18 PM    START taking these medications   Details  HYDROcodone-acetaminophen (NORCO/VICODIN) 5-325 MG per tablet Take 1 tablet by mouth every 4 (four) hours as needed., Starting 10/01/2014, Until Discontinued, Print    naproxen (NAPROSYN) 500 MG tablet Take 1 tablet (500 mg total) by mouth 2 (two) times daily., Starting 10/01/2014, Until Discontinued, Print            Sheena BattiestElizabeth Reco Shonk, NP 10/03/14 1339  Sheena MawKristen N Ward, DO 10/03/14 1627

## 2014-10-04 LAB — CULTURE, GROUP A STREP: Strep A Culture: POSITIVE — AB

## 2014-10-05 ENCOUNTER — Telehealth (HOSPITAL_BASED_OUTPATIENT_CLINIC_OR_DEPARTMENT_OTHER): Payer: Self-pay | Admitting: Emergency Medicine

## 2014-10-05 NOTE — Telephone Encounter (Signed)
Post ED Visit - Positive Culture Follow-up  Culture report reviewed by antimicrobial stewardship pharmacist: []  Wes Dulaney, Pharm.D., BCPS []  Celedonio MiyamotoJeremy Frens, [address] BoulevardPharm.D., BCPS []  Georgina PillionElizabeth Martin, [address] BoulevardPharm.D., BCPS [x]  [place]h Pham, [place]arm.D., BCPS, AAHIVP []  Estella HuskMichelle Turner, Pharm.D., BCPS, AAHIVP []  Elder CyphersLorie Poole, [address] BoulevardPharm.D., BCPS  Positive strep culture Treated with PCN in ED, organism sensitive to the same and no further patient follow-up is required at this time.  Berle MullMiller, Lakrista Scaduto 10/05/2014, 11:49 AM

## 2015-02-05 ENCOUNTER — Encounter (HOSPITAL_COMMUNITY): Payer: Self-pay | Admitting: Emergency Medicine

## 2015-02-05 ENCOUNTER — Emergency Department (HOSPITAL_COMMUNITY)
Admission: EM | Admit: 2015-02-05 | Discharge: 2015-02-05 | Disposition: A | Discharge status: Home / Self Care | Payer: 59 | Attending: Physician Assistant | Admitting: Physician Assistant

## 2015-02-05 DIAGNOSIS — Z791 Long term (current) use of non-steroidal anti-inflammatories (NSAID): Secondary | ICD-10-CM | POA: Diagnosis not present

## 2015-02-05 DIAGNOSIS — R0789 Other chest pain: Secondary | ICD-10-CM | POA: Insufficient documentation

## 2015-02-05 DIAGNOSIS — Z862 Personal history of diseases of the blood and blood-forming organs and certain disorders involving the immune mechanism: Secondary | ICD-10-CM | POA: Insufficient documentation

## 2015-02-05 DIAGNOSIS — J069 Acute upper respiratory infection, unspecified: Secondary | ICD-10-CM | POA: Insufficient documentation

## 2015-02-05 DIAGNOSIS — Z79899 Other long term (current) drug therapy: Secondary | ICD-10-CM | POA: Diagnosis not present

## 2015-02-05 DIAGNOSIS — R05 Cough: Secondary | ICD-10-CM | POA: Diagnosis present

## 2015-02-05 DIAGNOSIS — R51 Headache: Secondary | ICD-10-CM | POA: Diagnosis not present

## 2015-02-05 DIAGNOSIS — J209 Acute bronchitis, unspecified: Secondary | ICD-10-CM | POA: Insufficient documentation

## 2015-02-05 DIAGNOSIS — J40 Bronchitis, not specified as acute or chronic: Secondary | ICD-10-CM

## 2015-02-05 HISTORY — DX: Other seasonal allergic rhinitis: J30.2

## 2015-02-05 HISTORY — DX: Chronic sinusitis, unspecified: J32.9

## 2015-02-05 MED ORDER — HYDROCODONE-HOMATROPINE 5-1.5 MG/5ML PO SYRP: 5.0000 mL | ORAL_SOLUTION | Freq: Four times a day (QID) | ORAL | Status: DC | PRN

## 2015-02-05 NOTE — Discharge Instructions (Signed)
Upper Respiratory Infection, Adult °An upper respiratory infection (URI) is also sometimes known as the common cold. The upper respiratory tract includes the nose, sinuses, throat, trachea, and bronchi. Bronchi are the airways leading to the lungs. Most people improve within 1 week, but symptoms can last up to 2 weeks. A residual cough may last even longer.  °CAUSES °Many different viruses can infect the tissues lining the upper respiratory tract. The tissues become irritated and inflamed and often become very moist. Mucus production is also common. A cold is contagious. You can easily spread the virus to others by oral contact. This includes kissing, sharing a glass, coughing, or sneezing. Touching your mouth or nose and then touching a surface, which is then touched by another person, can also spread the virus. °SYMPTOMS  °Symptoms typically develop 1 to 3 days after you come in contact with a cold virus. Symptoms vary from person to person. They may include: °· Runny nose. °· Sneezing. °· Nasal congestion. °· Sinus irritation. °· Sore throat. °· Loss of voice (laryngitis). °· Cough. °· Fatigue. °· Muscle aches. °· Loss of appetite. °· Headache. °· Low-grade fever. °DIAGNOSIS  °You might diagnose your own cold based on familiar symptoms, since most people get a cold 2 to 3 times a year. Your caregiver can confirm this based on your exam. Most importantly, your caregiver can check that your symptoms are not due to another disease such as strep throat, sinusitis, pneumonia, asthma, or epiglottitis. Blood tests, throat tests, and X-rays are not necessary to diagnose a common cold, but they may sometimes be helpful in excluding other more serious diseases. Your caregiver will decide if any further tests are required. °RISKS AND COMPLICATIONS  °You may be at risk for a more severe case of the common cold if you smoke cigarettes, have chronic heart disease (such as heart failure) or lung disease (such as asthma), or if  you have a weakened immune system. The very young and very old are also at risk for more serious infections. Bacterial sinusitis, middle ear infections, and bacterial pneumonia can complicate the common cold. The common cold can worsen asthma and chronic obstructive pulmonary disease (COPD). Sometimes, these complications can require emergency medical care and may be life-threatening. °PREVENTION  °The best way to protect against getting a cold is to practice good hygiene. Avoid oral or hand contact with people with cold symptoms. Wash your hands often if contact occurs. There is no clear evidence that vitamin C, vitamin E, echinacea, or exercise reduces the chance of developing a cold. However, it is always recommended to get plenty of rest and practice good nutrition. °TREATMENT  °Treatment is directed at relieving symptoms. There is no cure. Antibiotics are not effective, because the infection is caused by a virus, not by bacteria. Treatment may include: °· Increased fluid intake. Sports drinks offer valuable electrolytes, sugars, and fluids. °· Breathing heated mist or steam (vaporizer or shower). °· Eating chicken soup or other clear broths, and maintaining good nutrition. °· Getting plenty of rest. °· Using gargles or lozenges for comfort. °· Controlling fevers with ibuprofen or acetaminophen as directed by your caregiver. °· Increasing usage of your inhaler if you have asthma. °Zinc gel and zinc lozenges, taken in the first 24 hours of the common cold, can shorten the duration and lessen the severity of symptoms. Pain medicines may help with fever, muscle aches, and throat pain. A variety of non-prescription medicines are available to treat congestion and runny nose. Your caregiver   can make recommendations and may suggest nasal or lung inhalers for other symptoms.  °HOME CARE INSTRUCTIONS  °· Only take over-the-counter or prescription medicines for pain, discomfort, or fever as directed by your  caregiver. °· Use a warm mist humidifier or inhale steam from a shower to increase air moisture. This may keep secretions moist and make it easier to breathe. °· Drink enough water and fluids to keep your urine clear or pale yellow. °· Rest as needed. °· Return to work when your temperature has returned to normal or as your caregiver advises. You may need to stay home longer to avoid infecting others. You can also use a face mask and careful hand washing to prevent spread of the virus. °SEEK MEDICAL CARE IF:  °· After the first few days, you feel you are getting worse rather than better. °· You need your caregiver's advice about medicines to control symptoms. °· You develop chills, worsening shortness of breath, or brown or red sputum. These may be signs of pneumonia. °· You develop yellow or brown nasal discharge or pain in the face, especially when you bend forward. These may be signs of sinusitis. °· You develop a fever, swollen neck glands, pain with swallowing, or white areas in the back of your throat. These may be signs of strep throat. °SEEK IMMEDIATE MEDICAL CARE IF:  °· You have a fever. °· You develop severe or persistent headache, ear pain, sinus pain, or chest pain. °· You develop wheezing, a prolonged cough, cough up blood, or have a change in your usual mucus (if you have chronic lung disease). °· You develop sore muscles or a stiff neck. °Document Released: 12/16/2000 Document Revised: 09/14/2011 Document Reviewed: 09/27/2013 °ExitCare® Patient Information ©2015 [employer]. This information is not intended to replace advice given to you by your health care provider. Make sure you discuss any questions you have with your health care provider. °Acute Bronchitis °Bronchitis is inflammation of the airways that extend from the windpipe into the lungs (bronchi). The inflammation often causes mucus to develop. This leads to a cough, which is the most common symptom of bronchitis.  °In acute bronchitis,  the condition usually develops suddenly and goes away over time, usually in a couple weeks. Smoking, allergies, and asthma can make bronchitis worse. Repeated episodes of bronchitis may cause further lung problems.  °CAUSES °Acute bronchitis is most often caused by the same virus that causes a cold. The virus can spread from person to person (contagious) through coughing, sneezing, and touching contaminated objects. °SIGNS AND SYMPTOMS  °· Cough.   °· Fever.   °· Coughing up mucus.   °· Body aches.   °· Chest congestion.   °· Chills.   °· Shortness of breath.   °· Sore throat.   °DIAGNOSIS  °Acute bronchitis is usually diagnosed through a physical exam. Your health care provider will also ask you questions about your medical history. Tests, such as chest X-rays, are sometimes done to rule out other conditions.  °TREATMENT  °Acute bronchitis usually goes away in a couple weeks. Oftentimes, no medical treatment is necessary. Medicines are sometimes given for relief of fever or cough. Antibiotic medicines are usually not needed but may be prescribed in certain situations. In some cases, an inhaler may be recommended to help reduce shortness of breath and control the cough. A cool mist vaporizer may also be used to help thin bronchial secretions and make it easier to clear the chest.  °HOME CARE INSTRUCTIONS °· Get plenty of rest.   °· Drink enough fluids   to keep your urine clear or pale yellow (unless you have a medical condition that requires fluid restriction). Increasing fluids may help thin your respiratory secretions (sputum) and reduce chest congestion, and it will prevent dehydration.   °· Take medicines only as directed by your health care provider. °· If you were prescribed an antibiotic medicine, finish it all even if you start to feel better. °· Avoid smoking and secondhand smoke. Exposure to cigarette smoke or irritating chemicals will make bronchitis worse. If you are a smoker, consider using nicotine gum  or skin patches to help control withdrawal symptoms. Quitting smoking will help your lungs heal faster.   °· Reduce the chances of another bout of acute bronchitis by washing your hands frequently, avoiding people with cold symptoms, and trying not to touch your hands to your mouth, nose, or eyes.   °· Keep all follow-up visits as directed by your health care provider.   °SEEK MEDICAL CARE IF: °Your symptoms do not improve after 1 week of treatment.  °SEEK IMMEDIATE MEDICAL CARE IF: °· You develop an increased fever or chills.   °· You have chest pain.   °· You have severe shortness of breath. °· You have bloody sputum.   °· You develop dehydration. °· You faint or repeatedly feel like you are going to pass out. °· You develop repeated vomiting. °· You develop a severe headache. °MAKE SURE YOU:  °· Understand these instructions. °· Will watch your condition. °· Will get help right away if you are not doing well or get worse. °Document Released: 07/30/2004 Document Revised: 11/06/2013 Document Reviewed: 12/13/2012 °ExitCare® Patient Information ©2015 [employer]. This information is not intended to replace advice given to you by your health care provider. Make sure you discuss any questions you have with your health care provider. ° °

## 2015-02-05 NOTE — ED Notes (Signed)
Pt reports 3 week hx of sinus congestion, cough , chest wall pain. Symptoms decreased over last week, then the cough increased this week. Treated with motrin and Dayquil, Sudafed. Lung clear bilaterally

## 2015-02-05 NOTE — ED Provider Notes (Signed)
CSN: [id number]     Arrival date & time 02/05/15  1359 History  This chart was scribed for non-physician practitioner, Roxy Horseman, PA-C working with Abelino Derrick, MD, by Jarvis Morgan, ED Scribe. This patient was seen in room WTR6/WTR6 and the patient's care was started at 2:22 PM.    Chief Complaint  Patient presents with  . Cough    chest6 wall pain with cough x 3 weeks  . Nasal Congestion  . Headache    3 week hx of sinus pressure    The history is provided by the patient. No language interpreter was used.    Sheena Cole is a 24 y.o. female who presents to the Emergency Department with a chief complaint of intermittent, moderate, gradually worsening, cough onset 3 weeks. She states she is having associated nasal congestion, chest wall pain, and SOB. She states the SOB is worse at night. She has been taking Motrin, Dayquil and Sudafed with no relief. 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 She denies any fever, chills, or wheezing.    Past Medical History  Diagnosis Date  . Anemia   . Seasonal allergies   . Sinus infection    Past Surgical History  Procedure Laterality Date  . No past surgeries     Family History  Problem Relation Age of Onset  . Other Neg Hx    History  Substance Use Topics  . Smoking status: Never Smoker   . Smokeless tobacco: Not on file  . Alcohol Use: Yes     Comment: socially   OB History    Gravida Para Term Preterm AB TAB SAB Ectopic Multiple Living       Review of Systems  Constitutional: Negative for fever and chills.  HENT: Positive for congestion and sinus pressure.   Respiratory: Positive for cough. Negative for wheezing.   Cardiovascular: Positive for chest pain (chest wall pain).  Neurological: Positive for headaches.      Allergies  Review of patient's allergies indicates no known allergies.  Home Medications   Prior to Admission  medications   Medication Sig Start Date End Date Taking? Authorizing Provider  HYDROcodone-acetaminophen (NORCO/VICODIN) 5-325 MG per tablet Take 1 tablet by mouth every 4 (four) hours as needed. 10/01/14   Harle Battiest, NP  ibuprofen (ADVIL,MOTRIN) 200 MG tablet Take 400 mg by mouth every 6 (six) hours as needed. For headache    Historical Provider, MD  naproxen (NAPROSYN) 500 MG tablet Take 1 tablet (500 mg total) by mouth 2 (two) times daily. 10/01/14   Harle Battiest, NP  naproxen sodium (ANAPROX) 220 MG tablet Take 220 mg by mouth daily as needed. For pain or headache    Historical Provider, MD   Triage Vitals: BP 117/74 mmHg  Pulse 94  Temp(Src) 98.2 F (36.8 C) (Oral)  Resp 18  Wt 140 lb (63.504 kg)  SpO2 99%  LMP 01/01/2015 (Exact Date)  Physical Exam  Constitutional: She is oriented to person, place, and time. She appears well-developed and well-nourished. No distress.  HENT:  Head: Normocephalic and atraumatic.  Right Ear: External ear normal.  Left Ear: External ear normal.  Mildly erythematous, no tonsillar exudate, no abscess, no stridor, uvula is midline  TMs clear bilaterally  Eyes: Conjunctivae and EOM are normal. Pupils are equal, round, and reactive to light.  Neck: Normal range of motion. Neck supple.  No tracheal deviation present.  Cardiovascular: Normal rate, regular rhythm and normal heart sounds.  Exam reveals no gallop and no friction rub.   No murmur heard. Pulmonary/Chest: Effort normal and breath sounds normal. No stridor. No respiratory distress. She has no wheezes. She has no rales. She exhibits no tenderness.  CTAB  Abdominal: Soft. Bowel sounds are normal. She exhibits no distension. There is no tenderness.  Musculoskeletal: Normal range of motion. She exhibits no tenderness.  Neurological: She is alert and oriented to person, place, and time.  Skin: Skin is warm and dry. No rash noted. She is not diaphoretic.  Psychiatric: She has a normal  mood and affect. Her behavior is normal. Judgment and thought content normal.  Nursing note and vitals reviewed.   ED Course  Procedures (including critical care time)  DIAGNOSTIC STUDIES: Oxygen Saturation is 99% on RA, normal by my interpretation.    COORDINATION OF CARE: 2:27 PM- Will prescribe pt with Hycodan cough medicine. Also advised pt to take Zyrtec or Claritin. Informed her if she develops high fever to return to ER for f/u    Labs Review Labs Reviewed - No data to display  Imaging Review No results found.   EKG Interpretation None      MDM   Final diagnoses:  URI (upper respiratory infection)  Bronchitis    Patient with cough, sore throat, sinus congestion x 3 weeks.  No fevers.  Lungs are clear.  Offered CXR, but discussed that I doubt pneumonia.  Has some chest wall pain, which is 2/2 cough. Patient ultimately declined CXR, but will take cough syrup.  Return precautions discussed.  Patient understands and agrees with the plan.  I personally performed the services described in this documentation, which was scribed in my presence. The recorded information has been reviewed and is accurate.      Roxy Horseman, PA-C 02/05/15 1443  Courteney Lyn Mackuen, MD 02/05/15 1501

## 2015-10-08 ENCOUNTER — Encounter (HOSPITAL_BASED_OUTPATIENT_CLINIC_OR_DEPARTMENT_OTHER): Payer: Self-pay | Admitting: Emergency Medicine

## 2015-10-08 ENCOUNTER — Emergency Department (HOSPITAL_BASED_OUTPATIENT_CLINIC_OR_DEPARTMENT_OTHER)
Admission: EM | Admit: 2015-10-08 | Discharge: 2015-10-08 | Disposition: A | Discharge status: Home / Self Care | Payer: 59 | Attending: Emergency Medicine | Admitting: Emergency Medicine

## 2015-10-08 DIAGNOSIS — Z791 Long term (current) use of non-steroidal anti-inflammatories (NSAID): Secondary | ICD-10-CM | POA: Insufficient documentation

## 2015-10-08 DIAGNOSIS — J309 Allergic rhinitis, unspecified: Secondary | ICD-10-CM | POA: Insufficient documentation

## 2015-10-08 DIAGNOSIS — J339 Nasal polyp, unspecified: Secondary | ICD-10-CM | POA: Diagnosis not present

## 2015-10-08 DIAGNOSIS — Z862 Personal history of diseases of the blood and blood-forming organs and certain disorders involving the immune mechanism: Secondary | ICD-10-CM | POA: Diagnosis not present

## 2015-10-08 DIAGNOSIS — Z8709 Personal history of other diseases of the respiratory system: Secondary | ICD-10-CM | POA: Diagnosis not present

## 2015-10-08 DIAGNOSIS — R0981 Nasal congestion: Secondary | ICD-10-CM | POA: Diagnosis present

## 2015-10-08 DIAGNOSIS — M791 Myalgia: Secondary | ICD-10-CM | POA: Insufficient documentation

## 2015-10-08 NOTE — Discharge Instructions (Signed)
Use nasal saline (you can try [employer] Simply Saline) at least 4 times a day, use saline 5-10 minutes before using the fluticasone (flonase) nasal spray  Do not use Afrin (Oxymetazoline)  Rest, wash hands frequently  and drink plenty of water.  You may try over the counter medication such as Mucinex or Allegra decongestants.  Push fluids to try to thin the mucus and get it to flow out the nose.     Allergic Rhinitis Allergic rhinitis is when the mucous membranes in the nose respond to allergens. Allergens are particles in the air that cause your body to have an allergic reaction. This causes you to release allergic antibodies. Through a chain of events, these eventually cause you to release histamine into the blood stream. Although meant to protect the body, it is this release of histamine that causes your discomfort, such as frequent sneezing, congestion, and an itchy, runny nose.  CAUSES Seasonal allergic rhinitis (hay fever) is caused by pollen allergens that may come from grasses, trees, and weeds. Year-round allergic rhinitis (perennial allergic rhinitis) is caused by allergens such as house dust mites, pet dander, and mold spores. SYMPTOMS  Nasal stuffiness (congestion).  Itchy, runny nose with sneezing and tearing of the eyes. DIAGNOSIS Your health care provider can help you determine the allergen or allergens that trigger your symptoms. If you and your health care provider are unable to determine the allergen, skin or blood testing may be used. Your health care provider will diagnose your condition after taking your health history and performing a physical exam. Your health care provider may assess you for other related conditions, such as asthma, pink eye, or an ear infection. TREATMENT Allergic rhinitis does not have a cure, but it can be controlled by:  Medicines that block allergy symptoms. These may include allergy shots, nasal sprays, and oral antihistamines.  Avoiding  the allergen. Hay fever may often be treated with antihistamines in pill or nasal spray forms. Antihistamines block the effects of histamine. There are over-the-counter medicines that may help with nasal congestion and swelling around the eyes. Check with your health care provider before taking or giving this medicine. If avoiding the allergen or the medicine prescribed do not work, there are many new medicines your health care provider can prescribe. Stronger medicine may be used if initial measures are ineffective. Desensitizing injections can be used if medicine and avoidance does not work. Desensitization is when a patient is given ongoing shots until the body becomes less sensitive to the allergen. Make sure you follow up with your health care provider if problems continue. HOME CARE INSTRUCTIONS It is not possible to completely avoid allergens, but you can reduce your symptoms by taking steps to limit your exposure to them. It helps to know exactly what you are allergic to so that you can avoid your specific triggers. SEEK MEDICAL CARE IF:  You have a fever.  You develop a cough that does not stop easily (persistent).  You have shortness of breath.  You start wheezing.  Symptoms interfere with normal daily activities.   This information is not intended to replace advice given to you by your health care provider. Make sure you discuss any questions you have with your health care provider.   Document Released: 03/17/2001 Document Revised: 07/13/2014 Document Reviewed: 02/27/2013 Elsevier Interactive Patient Education [employer]2016 [employer]

## 2015-10-08 NOTE — ED Notes (Signed)
Patient reports that she has had generalized soreness and nasal drainage x 4 days

## 2015-10-08 NOTE — ED Provider Notes (Signed)
CSN: [id number]     Arrival date & time 10/08/15  1440 History   First MD Initiated Contact with Patient 10/08/15 1607     Chief Complaint  Patient presents with  . Nasal Congestion     (Consider location/radiation/quality/duration/timing/severity/associated sxs/prior Treatment) HPI  Blood pressure 124/74, pulse 88, temperature 98.3 F (36.8 C), temperature source Oral, resp. rate 18, height  (1.753 m), weight 58.968 kg, last menstrual period 10/01/2015, SpO2 100 %.  Sheena Cole is a 25 y.o. female complaining ofNasal congestion and facial pressure worsening over the course of 4 days. Patient denies fever, chills, cough, rhinorrhea, otalgia, sore throat. 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 Denies focal headache, focal chest pain, focal abdominal pain, cervicalgia.  Past Medical History  Diagnosis Date  . Anemia   . Seasonal allergies   . Sinus infection    Past Surgical History  Procedure Laterality Date  . No past surgeries     Family History  Problem Relation Age of Onset  . Other Neg Hx    Social History  Substance Use Topics  . Smoking status: Never Smoker   . Smokeless tobacco: None  . Alcohol Use: Yes     Comment: socially   OB History    Gravida Para Term Preterm AB TAB SAB Ectopic Multiple Living       Review of Systems  10 systems reviewed and found to be negative, except as noted in the HPI.   Allergies  Review of patient's allergies indicates no known allergies.  Home Medications   Prior to Admission medications   Medication Sig Start Date End Date Taking? Authorizing Provider  HYDROcodone-acetaminophen (NORCO/VICODIN) 5-325 MG per tablet Take 1 tablet by mouth every 4 (four) hours as needed. 10/01/14   Harle Battiest, NP  HYDROcodone-homatropine [facility]) 5-1.5 MG/5ML syrup Take 5 mLs by mouth every 6 (six)  hours as needed for cough. 02/05/15   Roxy Horseman, PA-C  ibuprofen (ADVIL,MOTRIN) 200 MG tablet Take 400 mg by mouth every 6 (six) hours as needed. For headache    Historical Provider, MD  naproxen (NAPROSYN) 500 MG tablet Take 1 tablet (500 mg total) by mouth 2 (two) times daily. 10/01/14   Harle Battiest, NP  naproxen sodium (ANAPROX) 220 MG tablet Take 220 mg by mouth daily as needed. For pain or headache    Historical Provider, MD   BP 132/91 mmHg  Pulse 80  Temp(Src) 98.3 F (36.8 C) (Oral)  Resp 18  Ht  (1.753 m)  Wt 58.968 kg  BMI 19.19 kg/m2  SpO2 100%  LMP 10/01/2015 Physical Exam  Constitutional: She is oriented to person, place, and time. She appears well-developed and well-nourished. No distress.  HENT:  Head: Normocephalic.  Right Ear: External ear normal.  Left Ear: External ear normal.  Mouth/Throat: Oropharynx is clear and moist.  No drooling or stridor. Posterior pharynx mildly erythematous no significant tonsillar hypertrophy. No exudate. Soft palate rises symmetrically. No TTP or induration under tongue.   ++ Nasal polyps in left nares  No tenderness to palpation of frontal or bilateral maxillary sinuses.  No mucosal edema in the nares.  Bilateral tympanic membranes with normal architecture and good light reflex.    Eyes: Conjunctivae and EOM are normal. Pupils are equal, round, and reactive to light.  Neck: Normal range of  motion.  No midline C-spine  tenderness to palpation or step-offs appreciated. Patient has full range of motion without pain.  Grip strength, biceps, triceps 5/5 bilaterally;  can differentiate between pinprick and light touch bilaterally.   Cardiovascular: Normal rate, regular rhythm and intact distal pulses.   Pulmonary/Chest: Effort normal and breath sounds normal. No stridor. No respiratory distress. She has no wheezes. She has no rales. She exhibits no tenderness.  Abdominal: Soft.  Musculoskeletal: Normal range of  motion.  Neurological: She is alert and oriented to person, place, and time.  Psychiatric: She has a normal mood and affect.  Nursing note and vitals reviewed.   ED Course  Procedures (including critical care time) Labs Review Labs Reviewed - No data to display  Imaging Review No results found. I have personally reviewed and evaluated these images and lab results as part of my medical decision-making.   EKG Interpretation None      MDM   Final diagnoses:  Allergic rhinitis, unspecified allergic rhinitis type  Nasal polyps    Filed Vitals:   10/08/15 1448 10/08/15 1630 10/08/15 1647  BP: 132/91 113/69 124/74  Pulse: 80 85 88  Temp: 98.3 F (36.8 C)    TempSrc: Oral    Resp: 18 18 18   Height: 5\' 9"  (1.753 m)    Weight: 58.968 kg    SpO2: 100% 100% 100%   Sheena Cole is 25 y.o. female presenting with Nasal congestion and sinus pressure, she also has muscle aches starting yesterday. No sign of URI, I doubt this is flu however, given her exposure will write work note. Recommend Flonase and ENT follow-up for nasal polyps. I don't think this is an infectious sinusitis.  Evaluation does not show pathology that would require ongoing emergent intervention or inpatient treatment. Pt is hemodynamically stable and mentating appropriately. Discussed findings and plan with patient/guardian, who agrees with care plan. All questions answered. Return precautions discussed and outpatient follow up given.       Wynetta Emeryicole Biana Haggar, PA-C 10/08/15 1734  Lavera Guiseana Duo Liu, MD 10/09/15 1228

## 2019-09-09 ENCOUNTER — Ambulatory Visit: Payer: Self-pay | Attending: Internal Medicine

## 2019-09-09 DIAGNOSIS — Z23 Encounter for immunization: Secondary | ICD-10-CM | POA: Insufficient documentation

## 2019-09-09 NOTE — Progress Notes (Signed)
   Covid-19 Vaccination Clinic  Name:  Cleaster Shiffer    MRN: [id number] DOB: [birth year]  09/09/2019  Ms. Marczak was observed post Covid-19 immunization for 15 minutes without incident. She was provided with Vaccine Information Sheet and instruction to access the V-Safe system.   Ms. Treadway was instructed to call 911 with any severe reactions post vaccine: Marland Kitchen Difficulty breathing  . Swelling of face and throat  . A fast heartbeat  . A bad rash all over body  . Dizziness and weakness   Immunizations Administered    Name Date Dose VIS Date Route   Pfizer COVID-19 Vaccine 09/09/2019 12:58 PM 0.3 mL 06/16/2019 Intramuscular   Manufacturer: [employer], [employer]   Lot: [id number]   NDC: [address]-7

## 2019-09-30 ENCOUNTER — Ambulatory Visit: Payer: Self-pay | Attending: Internal Medicine

## 2019-09-30 DIAGNOSIS — Z23 Encounter for immunization: Secondary | ICD-10-CM

## 2019-09-30 NOTE — Progress Notes (Signed)
   Covid-19 Vaccination Clinic  Name:  Sheena Cole    MRN: [id number] DOB: [birth year]  09/30/2019  [name] was observed post Covid-19 immunization for 15 minutes without incident. She was provided with Vaccine Information Sheet and instruction to access the V-Safe system.   [name] was instructed to call 911 with any severe reactions post vaccine: Marland Kitchen Difficulty breathing  . Swelling of face and throat  . A fast heartbeat  . A bad rash all over body  . Dizziness and weakness   Immunizations Administered    Name Date Dose VIS Date Route   Pfizer COVID-19 Vaccine 09/30/2019  1:10 PM 0.3 mL 06/16/2019 Intramuscular   Manufacturer: [employer], [employer]   Lot: [id number]   NDC: [address]-3

## 2022-06-03 ENCOUNTER — Telehealth: Payer: BC Managed Care – PPO | Admitting: Physician Assistant

## 2022-06-03 ENCOUNTER — Encounter: Payer: Self-pay | Admitting: Physician Assistant

## 2022-06-03 DIAGNOSIS — H109 Unspecified conjunctivitis: Secondary | ICD-10-CM | POA: Diagnosis not present

## 2022-06-03 MED ORDER — POLYMYXIN B-TRIMETHOPRIM 10000-0.1 UNIT/ML-% OP SOLN: OPHTHALMIC | 0 refills | Status: DC

## 2022-06-03 NOTE — Patient Instructions (Signed)
Sheena Cole, thank you for joining Piedad Climes, PA-C for today's virtual visit.  While this provider is not your primary care provider (PCP), if your PCP is located in our provider database this encounter information will be shared with them immediately following your visit.   A [facility] MyChart account gives you access to today's visit and all your visits, tests, and labs performed at [facility] " click here if you don't have a [facility] MyChart account or go to [email or web address]  Consent: (Patient) Sheena Cole provided verbal consent for this virtual visit at the beginning of the encounter.  Current Medications:  Current Outpatient Medications:    HYDROcodone-acetaminophen (NORCO/VICODIN) 5-325 MG per tablet, Take 1 tablet by mouth every 4 (four) hours as needed., Disp: 6 tablet, Rfl: 0   HYDROcodone-homatropine (HYCODAN) 5-1.5 MG/5ML syrup, Take 5 mLs by mouth every 6 (six) hours as needed for cough., Disp: 120 mL, Rfl: 0   ibuprofen (ADVIL,MOTRIN) 200 MG tablet, Take 400 mg by mouth every 6 (six) hours as needed. For headache, Disp: , Rfl:    naproxen (NAPROSYN) 500 MG tablet, Take 1 tablet (500 mg total) by mouth 2 (two) times daily., Disp: 30 tablet, Rfl: 0   naproxen sodium (ANAPROX) 220 MG tablet, Take 220 mg by mouth daily as needed. For pain or headache, Disp: , Rfl:    Medications ordered in this encounter:  No orders of the defined types were placed in this encounter.    *If you need refills on other medications prior to your next appointment, please contact your pharmacy*  Follow-Up: Call back or seek an in-person evaluation if the symptoms worsen or if the condition fails to improve as anticipated.  [facility] [phone]  Other Instructions Bacterial Conjunctivitis, Adult Bacterial conjunctivitis is an infection of your conjunctiva. This is the clear membrane that covers the white part of your eye and  the inner part of your eyelid. This infection can make your eye: Red or pink. Itchy or irritated. This condition spreads easily from person to person (is contagious) and from one eye to the other eye. What are the causes? This condition is caused by germs (bacteria). You may get the infection if you come into close contact with: A person who has the infection. Items that have germs on them (are contaminated), such as face towels, contact lens solution, or eye makeup. What increases the risk? You are more likely to get this condition if: You have contact with people who have the infection. You wear contact lenses. You have a sinus infection. You have had a recent eye injury or surgery. You have a weak body defense system (immune system). You have dry eyes. What are the signs or symptoms?  Thick, yellowish discharge from the eye. Tearing or watery eyes. Itchy eyes. Burning feeling in your eyes. Eye redness. Swollen eyelids. Blurred vision. How is this treated?  Antibiotic eye drops or ointment. Antibiotic medicine taken by mouth. This is used for infections that do not get better with drops or ointment or that last more than 10 days. Cool, wet cloths placed on the eyes. Artificial tears used 2-6 times a day. Follow these instructions at home: Medicines Take or apply your antibiotic medicine as told by your doctor. Do not stop using it even if you start to feel better. Take or apply over-the-counter and prescription medicines only as told by your doctor. Do not touch your eyelid with the eye-drop bottle or the  ointment tube. Managing discomfort Wipe any fluid from your eye with a warm, wet washcloth or a cotton ball. Place a clean, cool, wet cloth on your eye. Do this for 10-20 minutes, 3-4 times a day. General instructions Do not wear contacts until the infection is gone. Wear glasses until your doctor says it is okay to wear contacts again. Do not wear eye makeup until the  infection is gone. Throw away old eye makeup. Change or wash your pillowcase every day. Do not share towels or washcloths. Wash your hands often with soap and water for at least 20 seconds and especially before touching your face or eyes. Use paper towels to dry your hands. Do not touch or rub your eyes. Do not drive or use heavy machinery if your vision is blurred. Contact a doctor if: You have a fever. You do not get better after 10 days. Get help right away if: You have a fever and your symptoms get worse all of a sudden. You have very bad pain when you move your eye. Your face: Hurts. Is red. Is swollen. You have sudden loss of vision. Summary Bacterial conjunctivitis is an infection of your conjunctiva. This infection spreads easily from person to person. Wash your hands often with soap and water for at least 20 seconds and especially before touching your face or eyes. Use paper towels to dry your hands. Take or apply your antibiotic medicine as told by your doctor. Contact a doctor if you have a fever or you do not get better after 10 days. This information is not intended to replace advice given to you by your health care provider. Make sure you discuss any questions you have with your health care provider. Document Revised: 10/02/2020 Document Reviewed: 10/02/2020 Elsevier Patient Education  2023 Elsevier Inc.    If you have been instructed to have an in-person evaluation today at a local Urgent Care facility, please use the link below. It will take you to a list of all of our available [facility], including address, phone number and hours of operation. Please do not delay care.  [facility]  If you or a family member do not have a primary care provider, use the link below to schedule a visit and establish care. When you choose a [facility] primary care physician or advanced practice provider, you gain a long-term partner in health. Find a Primary  Care Provider  Learn more about [facility]'s in-office and virtual care options:  - Get Care Now

## 2022-06-03 NOTE — Progress Notes (Signed)
Virtual Visit Consent   Sheena Cole, you are scheduled for a virtual visit with a [facility] provider today. Just as with appointments in the office, your consent must be obtained to participate. Your consent will be active for this visit and any virtual visit you may have with one of our providers in the next 365 days. If you have a MyChart account, a copy of this consent can be sent to you electronically.  As this is a virtual visit, video technology does not allow for your provider to perform a traditional examination. This may limit your provider's ability to fully assess your condition. If your provider identifies any concerns that need to be evaluated in person or the need to arrange testing (such as labs, EKG, etc.), we will make arrangements to do so. Although advances in technology are sophisticated, we cannot ensure that it will always work on either your end or our end. If the connection with a video visit is poor, the visit may have to be switched to a telephone visit. With either a video or telephone visit, we are not always able to ensure that we have a secure connection.  By engaging in this virtual visit, you consent to the provision of healthcare and authorize for your insurance to be billed (if applicable) for the services provided during this visit. Depending on your insurance coverage, you may receive a charge related to this service.  I need to obtain your verbal consent now. Are you willing to proceed with your visit today? Sheena Cole has provided verbal consent on 06/03/2022 for a virtual visit (video or telephone). Piedad Climes, [place]  Date: 06/03/2022 6:50 PM  Virtual Visit via Video Note   I, Piedad Climes, connected with  Sheena Cole  ([id number], [birth year]) on 06/03/22 at  6:45 PM EST by a video-enabled telemedicine application and verified that I am speaking with the correct person using two identifiers.  Location: Patient:  Virtual Visit Location Patient: Home Provider: Virtual Visit Location Provider: Home Office   I discussed the limitations of evaluation and management by telemedicine and the availability of in person appointments. The patient expressed understanding and agreed to proceed.    History of Present Illness: Sheena Cole is a 31 y.o. who identifies as a female who was assigned female at birth, and is being seen today for left eye redness and irritation noted on waking this morning. Progressed throughout the day with drainage. Denies vision change or overt eye pain. Denies URI symptoms, fever, chills. Is an [employer] information [employer] manager. She does wear contact lenses but took these out this morning.  HPI: HPI  Problems: There are no problems to display for this patient.   Allergies: No Known Allergies Medications:  Current Outpatient Medications:    trimethoprim-polymyxin b (POLYTRIM) ophthalmic solution, Apply 1-2 drops into affected eye QID x 5 days., Disp: 10 mL, Rfl: 0   norethindrone-ethinyl estradiol-FE (LOESTRIN FE) 1-20 MG-MCG tablet, Take 1 tablet by mouth daily., Disp: , Rfl:   Observations/Objective: Patient is well-developed, well-nourished in no acute distress.  Resting comfortably at home.  Head is normocephalic, atraumatic.  No labored breathing. Speech is clear and coherent with logical content.  Patient is alert and oriented at baseline.   Assessment and Plan: 1. Bacterial conjunctivitis of left eye - trimethoprim-polymyxin b (POLYTRIM) ophthalmic solution; Apply 1-2 drops into affected eye QID x 5 days.  Dispense: 10 mL; Refill: 0  Hand hygiene discussed. Avoid rubbing the eyes.  Start warm compresses as directed. Polytrim per orders. She is to refrain from contact lenses use until this is resolved, putting in a new pair at that time.   Follow Up Instructions: I discussed the assessment and treatment plan with the patient. The patient was provided an opportunity to ask  questions and all were answered. The patient agreed with the plan and demonstrated an understanding of the instructions.  A copy of instructions were sent to the patient via MyChart unless otherwise noted below.   The patient was advised to call back or seek an in-person evaluation if the symptoms worsen or if the condition fails to improve as anticipated.  Time:  I spent 8 minutes with the patient via telehealth technology discussing the above problems/concerns.    Piedad Climes, PA-C

## 2022-10-19 ENCOUNTER — Telehealth: Payer: BC Managed Care – PPO | Admitting: Physician Assistant

## 2022-10-19 DIAGNOSIS — J019 Acute sinusitis, unspecified: Secondary | ICD-10-CM

## 2022-10-19 DIAGNOSIS — B9789 Other viral agents as the cause of diseases classified elsewhere: Secondary | ICD-10-CM

## 2022-10-19 MED ORDER — AZELASTINE HCL 0.1 % NA SOLN: 1.0000 | Freq: Two times a day (BID) | NASAL | 0 refills | Status: AC

## 2022-10-19 NOTE — Progress Notes (Signed)
E-Visit for Sinus Problems  We are sorry that you are not feeling well.  Here is how we plan to help!  Based on what you have shared with me it looks like you have sinusitis.  Sinusitis is inflammation and infection in the sinus cavities of the head.  Based on your presentation I believe you most likely have Acute Viral Sinusitis.This is an infection most likely caused by a virus. There is not specific treatment for viral sinusitis other than to help you with the symptoms until the infection runs its course.  You may use an oral decongestant such as Mucinex D or if you have glaucoma or high blood pressure use plain Mucinex. Saline nasal spray help and can safely be used as often as needed for congestion, I have prescribed: Azelastine nasal spray 2 sprays in each nostril twice a day  Some authorities believe that zinc sprays or the use of Echinacea may shorten the course of your symptoms.  Sinus infections are not as easily transmitted as other respiratory infection, however we still recommend that you avoid close contact with loved ones, especially the very young and elderly.  Remember to wash your hands thoroughly throughout the day as this is the number one way to prevent the spread of infection!  Home Care: Only take medications as instructed by your medical team. Do not take these medications with alcohol. A steam or ultrasonic humidifier can help congestion.  You can place a towel over your head and breathe in the steam from hot water coming from a faucet. Avoid close contacts especially the very young and the elderly. Cover your mouth when you cough or sneeze. Always remember to wash your hands.  Get Help Right Away If: You develop worsening fever or sinus pain. You develop a severe head ache or visual changes. Your symptoms persist after you have completed your treatment plan.  Make sure you Understand these instructions. Will watch your condition. Will get help right away if you are  not doing well or get worse.   Thank you for choosing an e-visit.  Your e-visit answers were reviewed by a board certified advanced clinical practitioner to complete your personal care plan. Depending upon the condition, your plan could have included both over the counter or prescription medications.  Please review your pharmacy choice. Make sure the pharmacy is open so you can pick up prescription now. If there is a problem, you may contact your provider through MyChart messaging and have the prescription routed to another pharmacy.  Your safety is important to us. If you have drug allergies check your prescription carefully.   For the next 24 hours you can use MyChart to ask questions about today's visit, request a non-urgent call back, or ask for a work or school excuse. You will get an email in the next two days asking about your experience. I hope that your e-visit has been valuable and will speed your recovery.  I have spent 5 minutes in review of e-visit questionnaire, review and updating patient chart, medical decision making and response to patient.   [place] Maiolo M Hadar Elgersma, PA-C
